# Patient Record
Sex: Female | Born: 1955 | Race: White | Hispanic: No | Marital: Married | Smoking: Never smoker
Health system: Southern US, Community
[De-identification: ages and names within clinical notes are randomized; demographics above are authoritative.]

## PROBLEM LIST (undated history)

## (undated) DIAGNOSIS — M4120 Other idiopathic scoliosis, site unspecified: Secondary | ICD-10-CM

## (undated) DIAGNOSIS — R0789 Other chest pain: Secondary | ICD-10-CM

## (undated) DIAGNOSIS — N809 Endometriosis, unspecified: Secondary | ICD-10-CM

## (undated) DIAGNOSIS — Z516 Encounter for desensitization to allergens: Secondary | ICD-10-CM

## (undated) DIAGNOSIS — Z86010 Personal history of colonic polyps: Secondary | ICD-10-CM

## (undated) DIAGNOSIS — K649 Unspecified hemorrhoids: Secondary | ICD-10-CM

## (undated) DIAGNOSIS — T7840XA Allergy, unspecified, initial encounter: Secondary | ICD-10-CM

## (undated) DIAGNOSIS — E785 Hyperlipidemia, unspecified: Secondary | ICD-10-CM

## (undated) HISTORY — DX: Other chest pain: R07.89

## (undated) HISTORY — DX: Personal history of colonic polyps: Z86.010

## (undated) HISTORY — DX: Encounter for desensitization to allergens: Z51.6

## (undated) HISTORY — DX: Unspecified hemorrhoids: K64.9

## (undated) HISTORY — DX: Endometriosis, unspecified: N80.9

## (undated) HISTORY — DX: Hyperlipidemia, unspecified: E78.5

## (undated) HISTORY — DX: Allergy, unspecified, initial encounter: T78.40XA

## (undated) HISTORY — DX: Other idiopathic scoliosis, site unspecified: M41.20

---

## 1982-10-08 HISTORY — PX: OSTEOTOMY: SHX137

## 1999-12-05 ENCOUNTER — Other Ambulatory Visit: Admission: RE | Admit: 1999-12-05 | Discharge: 1999-12-05 | Payer: Self-pay | Admitting: Obstetrics and Gynecology

## 2000-04-05 ENCOUNTER — Encounter (INDEPENDENT_AMBULATORY_CARE_PROVIDER_SITE_OTHER): Payer: Self-pay

## 2000-04-05 ENCOUNTER — Ambulatory Visit (HOSPITAL_COMMUNITY): Admission: RE | Admit: 2000-04-05 | Discharge: 2000-04-05 | Payer: Self-pay | Admitting: Obstetrics and Gynecology

## 2000-12-10 ENCOUNTER — Other Ambulatory Visit: Admission: RE | Admit: 2000-12-10 | Discharge: 2000-12-10 | Payer: Self-pay | Admitting: Obstetrics and Gynecology

## 2002-10-08 HISTORY — PX: ENDOMETRIAL ABLATION: SHX621

## 2003-06-22 ENCOUNTER — Other Ambulatory Visit: Admission: RE | Admit: 2003-06-22 | Discharge: 2003-06-22 | Payer: Self-pay | Admitting: Obstetrics and Gynecology

## 2003-08-10 ENCOUNTER — Encounter: Payer: Self-pay | Admitting: Internal Medicine

## 2003-08-12 ENCOUNTER — Encounter: Payer: Self-pay | Admitting: Internal Medicine

## 2004-09-05 ENCOUNTER — Other Ambulatory Visit: Admission: RE | Admit: 2004-09-05 | Discharge: 2004-09-05 | Payer: Self-pay | Admitting: Obstetrics and Gynecology

## 2005-09-28 ENCOUNTER — Encounter: Payer: Self-pay | Admitting: Internal Medicine

## 2005-10-02 ENCOUNTER — Other Ambulatory Visit: Admission: RE | Admit: 2005-10-02 | Discharge: 2005-10-02 | Payer: Self-pay | Admitting: Obstetrics and Gynecology

## 2007-01-02 ENCOUNTER — Encounter: Payer: Self-pay | Admitting: Internal Medicine

## 2007-08-12 ENCOUNTER — Encounter: Payer: Self-pay | Admitting: Internal Medicine

## 2007-12-14 LAB — CONVERTED CEMR LAB: Pap Smear: NORMAL

## 2008-03-16 ENCOUNTER — Encounter: Payer: Self-pay | Admitting: Internal Medicine

## 2008-08-27 ENCOUNTER — Encounter: Payer: Self-pay | Admitting: Internal Medicine

## 2008-09-16 ENCOUNTER — Encounter: Payer: Self-pay | Admitting: Internal Medicine

## 2008-09-24 ENCOUNTER — Ambulatory Visit: Payer: Self-pay | Admitting: Internal Medicine

## 2008-09-24 DIAGNOSIS — N809 Endometriosis, unspecified: Secondary | ICD-10-CM

## 2008-09-24 DIAGNOSIS — M4120 Other idiopathic scoliosis, site unspecified: Secondary | ICD-10-CM | POA: Insufficient documentation

## 2008-09-24 DIAGNOSIS — K649 Unspecified hemorrhoids: Secondary | ICD-10-CM | POA: Insufficient documentation

## 2008-09-24 DIAGNOSIS — L719 Rosacea, unspecified: Secondary | ICD-10-CM | POA: Insufficient documentation

## 2008-09-24 DIAGNOSIS — R0789 Other chest pain: Secondary | ICD-10-CM

## 2008-09-24 DIAGNOSIS — N6019 Diffuse cystic mastopathy of unspecified breast: Secondary | ICD-10-CM | POA: Insufficient documentation

## 2008-09-24 HISTORY — DX: Unspecified hemorrhoids: K64.9

## 2008-09-24 HISTORY — DX: Endometriosis, unspecified: N80.9

## 2008-09-24 HISTORY — DX: Other idiopathic scoliosis, site unspecified: M41.20

## 2008-09-24 HISTORY — DX: Other chest pain: R07.89

## 2008-09-24 LAB — CONVERTED CEMR LAB: Vit D, 1,25-Dihydroxy: 49 (ref 30–89)

## 2008-09-29 ENCOUNTER — Encounter: Payer: Self-pay | Admitting: Internal Medicine

## 2008-09-29 LAB — CONVERTED CEMR LAB
ALT: 18 units/L (ref 0–35)
AST: 24 units/L (ref 0–37)
Albumin: 4.2 g/dL (ref 3.5–5.2)
Alkaline Phosphatase: 25 units/L — ABNORMAL LOW (ref 39–117)
BUN: 11 mg/dL (ref 6–23)
Basophils Absolute: 0 10*3/uL (ref 0.0–0.1)
Basophils Relative: 0.1 % (ref 0.0–3.0)
Bilirubin, Direct: 0.1 mg/dL (ref 0.0–0.3)
CO2: 28 meq/L (ref 19–32)
CRP, High Sensitivity: <1 — ABNORMAL LOW (ref 0.00–5.00)
Calcium: 9.3 mg/dL (ref 8.4–10.5)
Chloride: 106 meq/L (ref 96–112)
Cholesterol: 211 mg/dL (ref 0–200)
Creatinine, Ser: 0.7 mg/dL (ref 0.4–1.2)
Direct LDL: 109.2 mg/dL
Eosinophils Absolute: 0.1 10*3/uL (ref 0.0–0.7)
Eosinophils Relative: 1.9 % (ref 0.0–5.0)
GFR calc Af Amer: 114 mL/min
GFR calc non Af Amer: 94 mL/min
Glucose, Bld: 93 mg/dL (ref 70–99)
HCT: 36.1 % (ref 36.0–46.0)
HDL: 90.3 mg/dL (ref >39.0)
Hemoglobin: 12.3 g/dL (ref 12.0–15.0)
Lymphocytes Relative: 22.8 % (ref 12.0–46.0)
MCHC: 34 g/dL (ref 30.0–36.0)
MCV: 95.6 fL (ref 78.0–100.0)
Monocytes Absolute: 0.5 10*3/uL (ref 0.1–1.0)
Monocytes Relative: 8.4 % (ref 3.0–12.0)
Neutro Abs: 3.7 10*3/uL (ref 1.4–7.7)
Neutrophils Relative %: 66.8 % (ref 43.0–77.0)
Platelets: 257 10*3/uL (ref 150–400)
Potassium: 3.8 meq/L (ref 3.5–5.1)
RBC: 3.78 M/uL — ABNORMAL LOW (ref 3.87–5.11)
RDW: 12.8 % (ref 11.5–14.6)
Sodium: 140 meq/L (ref 135–145)
TSH: 1.11 microintl units/mL (ref 0.35–5.50)
Total Bilirubin: 0.7 mg/dL (ref 0.3–1.2)
Total CHOL/HDL Ratio: 2.3
Total Protein: 7.1 g/dL (ref 6.0–8.3)
Triglycerides: 28 mg/dL (ref 0–149)
VLDL: 6 mg/dL (ref 0–40)
WBC: 5.6 10*3/uL (ref 4.5–10.5)

## 2008-10-04 DIAGNOSIS — Z860100 Personal history of colon polyps, unspecified: Secondary | ICD-10-CM

## 2008-10-04 HISTORY — DX: Personal history of colon polyps, unspecified: Z86.0100

## 2008-10-04 HISTORY — DX: Personal history of colonic polyps: Z86.010

## 2008-11-30 ENCOUNTER — Encounter: Payer: Self-pay | Admitting: *Deleted

## 2008-11-30 ENCOUNTER — Encounter: Payer: Self-pay | Admitting: Internal Medicine

## 2008-11-30 LAB — CONVERTED CEMR LAB: Pap Smear: NORMAL

## 2008-12-15 ENCOUNTER — Encounter: Payer: Self-pay | Admitting: *Deleted

## 2009-12-29 ENCOUNTER — Encounter: Payer: Self-pay | Admitting: Internal Medicine

## 2010-01-09 ENCOUNTER — Encounter: Payer: Self-pay | Admitting: Internal Medicine

## 2010-01-10 ENCOUNTER — Encounter: Payer: Self-pay | Admitting: Internal Medicine

## 2010-02-21 ENCOUNTER — Encounter: Payer: Self-pay | Admitting: Internal Medicine

## 2010-11-07 NOTE — Letter (Signed)
Summary: Test Results Letter/[facility]  Test Results Letter/[facility]   Imported By: Maryln Gottron 01/20/2010 08:55:35  _____________________________________________________________________  External Attachment:    Type:   Image     Comment:   External Document

## 2010-11-07 NOTE — Assessment & Plan Note (Signed)
Summary: to be est/pt will come in fasting/ok per doc/njr   Vital Signs:  Patient Profile:   55 Years Old Female Height:     62.5 inches Weight:      113 pounds BMI:     20.41 Pulse rate:   70 / minute BP sitting:   120 / 72  (left arm) Cuff size:   regular  Vitals Entered By: Romualdo Bolk, CMA (September 24, 2008 8:11 AM)                 Chief Complaint:  New Patient.  History of Present Illness: Tracey Patrick is here to get established. She has no PCP but gyne and GI.  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 [facility] BRINGS RECORDS TODAY  for review. Discuss hemorrhoids  recurrent   had sclerotherapy She has recurrent signs despite this rx under colonoscopy. Has questions about  family counselors : family stress.  49 yo step son  at home.  Pt had a cup of tea no cream or sugar . Pt would like to have labs done. Has not had done in a while .     Prior Medications Reviewed Using: Patient Recall  Updated Prior Medication List: MULTIVITAMINS   TABS (MULTIPLE VITAMIN)  OMEGA-3 COMPLEX 192-251-11 MG-MG-UNIT CAPS (DHA-EPA-VITAMIN E)  FLAX   OIL (FLAXSEED (LINSEED))  VITAMIN D 1000 UNIT  TABS (CHOLECALCIFEROL)  RED YEAST RICE 600 MG CAPS (RED YEAST RICE EXTRACT)  CALCIUM CITRATE   POWD (CALCIUM CITRATE TETRAHYDRATE)  EYE VITAMINS  CAPS (MULTIPLE VITAMINS-MINERALS)  * PROBIOTICS  * RESVERATROL- GRAPE SEED EXTRACT  ASPIRIN 81 MG  TABS (ASPIRIN)   Current Allergies (reviewed today): No known allergies   Past Medical History:      G1P0    Echo  2003/03/17 normal Eval by Dr Daleen Squibb for atypical chest pain    Endometriosis    Mild rosacea        LAST    Mammogram: 3/09    Pap: 3/09    Td: 09/29/99    Colonscopy: 08/2008    EKG: 2003-03-17    Dexa: 3/08    Eye Exam: n/a    Smoking: never    Consults    Dr. Kinnie Scales    Dr. Adalberto Ill    Dr. Juanito Doom    Dr. Marciano Sequin    Blood transfusion    Colonic polyps, hx of  Past Surgical History:    Colonoscopy  and Hemorrhoid injections 11/09    Laparoscopy (endometriosis) 03-16-00    Maxillary Osteotomy for overbite 1984    Colon polypectomy 11/09   Family History:    Father: CVS, MI, osteoarthristis, hypertension, psoriasis, GI ulcers, Macular Degeneration, Hypercholesterolemia, giant cell arteritis    Mother: Died of MVA 03-16-2005, breast ca, hypertension, elevated cholesterol, mild MI, fibroid uterine tumors requiring hysterectomy, djd, osteoporosis    Siblings:  hypertenstion, elevated cholesterol, gout and rosacea  Social History:    Occupation: MD opthalmologist.    Never Smoked    Alcohol use-yes  1per night     Drug use-no    Regular exercise-yes    HH of 3  : husband and 4 yo step son.    Married   Risk Factors:  Tobacco use:  never Drug use:  no Alcohol use:  yes    Type:  wine    Drinks per day:  1 Exercise:  yes    Times per week:  5  Type:  aerobics and yoga  Colonoscopy History:     Date of Last Colonoscopy:  08/27/2008    Results:  Normal   PAP Smear History:     Date of Last PAP Smear:  12/14/2007    Results:  Normal   Mammogram History:     Date of Last Mammogram:  12/07/2007    Results:  Normal Bilateral    Review of Systems  The patient denies anorexia, fever, vision loss, syncope, dyspnea on exertion, peripheral edema, prolonged cough, abdominal pain, melena, hematochezia, severe indigestion/heartburn, depression, unusual weight change, and enlarged lymph nodes.         almost regular.       Physical Exam  General:     Well-developed,well-nourished,in no acute distress; alert,appropriate and cooperative throughout examination Head:     normocephalic and atraumatic.   Eyes:     vision grossly intact.   Neck:     No deformities, masses, or tenderness noted.no thyromegaly.   Chest Wall:     ? mild scoliosis sitting Lungs:     Normal respiratory effort, chest expands symmetrically. Lungs are clear to auscultation, no crackles or wheezes. Heart:      normal rate, regular rhythm, no gallop, and no lifts.   Abdomen:     Bowel sounds positive,abdomen soft and non-tender without masses, organomegalynoted. Pulses:     intact wiithout delay Extremities:     no cce  Neurologic:     non focal alert & oriented X3 and gait normal.   Skin:     turgor normal, color normal, no ecchymoses, and no petechiae.   Cervical Nodes:     No lymphadenopathy noted Psych:     Oriented X3, good eye contact, not anxious appearing, and not depressed appearing.   Additional Exam:     records received and reviewed.     Impression & Recommendations:  Problem # 1:  HEMORRHOIDS (ICD-455.6) S/p rx and call if needs refilll on meds. She will call for Surg consult  when  approprite.   Problem # 2:  PREVENTIVE HEALTH CARE (ICD-V70.0) fammily stress due for labs routine and risk assessment. Disc family stress and names of potential family therapists. Orders: T-Vitamin D (25-Hydroxy) [phone]) Venipuncture [phone]) TLB-TSH (Thyroid Stimulating Hormone) (84443-TSH) TLB-Hepatic/Liver Function Pnl (80076-HEPATIC) TLB-CBC Platelet - w/Differential (85025-CBCD) TLB-BMP (Basic Metabolic Panel-BMET) (80048-METABOL) TLB-Lipid Panel (80061-LIPID) TLB-CRP-Full Range (C-Reactive Protein) (86140-FCRP)   Problem # 3:  ENDOMETRIOSIS (ICD-617.9) Assessment: Comment Only  Problem # 4:  FIBROCYSTIC BREAST DISEASE (ICD-610.1) Assessment: Comment Only  Problem # 5:  SCOLIOSIS (ICD-737.30) Assessment: Comment Only  Problem # 6:  ROSACEA (ICD-695.3) Assessment: Comment Only  Complete Medication List: 1)  Multivitamins Tabs (Multiple vitamin) 2)  Omega-3 Complex 192-251-11 Mg-mg-unit Caps (Dha-epa-vitamin e) 3)  Flax Oil (Flaxseed (linseed)) 4)  Vitamin D 1000 Unit Tabs (Cholecalciferol) 5)  Red Yeast Rice 600 Mg Caps (Red yeast rice extract) 6)  Calcium Citrate Powd (Calcium citrate tetrahydrate) 7)  Eye Vitamins Caps (Multiple vitamins-minerals) 8)   Probiotics  9)  Resveratrol- Grape Seed Extract  10)  Aspirin 81 Mg Tabs (Aspirin) 11)  Lidocaine-hydrocortisone Ace 3-0.5 % Crea (Lidocaine-hydrocortisone ace) .... Apply rectally two times a day as directed  Other Orders: Tdap => 23yrs IM (57846) Admin 1st Vaccine (96295)   Patient Instructions: 1)  will notify your of lab results  2)  If ok  Yearly cpx  or as needed.   ]  Tetanus/Td Vaccine    Vaccine Type: Tdap  Site: right deltoid    Mfr: Sanofi Pasteur    Dose: 0.5 ml    Route: IM    Given by: Romualdo Bolk, CMA    Exp. Date: 10/15/2010    Lot #: [id number]

## 2010-11-07 NOTE — Letter (Signed)
Summary: Test Results Letter/[facility]  Test Results Letter/[facility]   Imported By: Maryln Gottron 01/20/2010 08:57:48  _____________________________________________________________________  External Attachment:    Type:   Image     Comment:   External Document

## 2010-11-07 NOTE — Letter (Signed)
Summary: Generic Letter  [place] [facility]  [address] [place], [place] [address]   Phone: [phone]  Fax: [phone]    09/29/2008  [facility] [address] [place], [place]  [address]  Dear Dr. Elmer Picker,  Your labs are good including your lipids. I have included a copy for your records as well. If you have any questions, please give me a call at [phone].         Sincerely,   Tor Netters, CMA [place] [facility]

## 2010-11-07 NOTE — Letter (Signed)
Summary: Immunization Records 1957 thru 2006  Immunization Records 1957 thru 2006   Imported By: Maryln Gottron 10/18/2008 12:47:51  _____________________________________________________________________  External Attachment:    Type:   Image     Comment:   External Document

## 2010-11-07 NOTE — Miscellaneous (Signed)
Summary: MMG and Pap results  Clinical Lists Changes  Observations: Added new observation of TDBOOSTDUE: 09/24/2018 (12/15/2008 9:29) Added new observation of HDLNXTDUE: 09/24/2013 (12/15/2008 9:29) Added new observation of LDLNXTDUE: 09/24/2013 (12/15/2008 9:29) Added new observation of COLONNXTDUE: 08/27/2018 (12/15/2008 9:29) Added new observation of PAP DUE: 12/13/2008 (12/15/2008 9:29) Added new observation of MAMMO DUE: 12/06/2008 (12/15/2008 9:29) Added new observation of CREATNXTDUE: 09/24/2009 (12/15/2008 9:29) Added new observation of POTASSIUMDUE: 09/24/2009 (12/15/2008 9:29) Added new observation of MAMMOGRAM: normal (11/30/2008 9:30) Added new observation of PAP SMEAR: normal (11/30/2008 9:30)         Preventive Care Screening  Colonoscopy:    Next Due:  08/27/2018  Pap Smear:    Date:  11/30/2008    Next Due:  12/13/2008    Results:  normal  Mammogram:    Date:  11/30/2008    Next Due:  12/06/2008    Results:  normal

## 2010-11-07 NOTE — Progress Notes (Signed)
Summary: Health History provided by patient  Health History provided by patient   Imported By: Maryln Gottron 10/18/2008 13:01:20  _____________________________________________________________________  External Attachment:    Type:   Image     Comment:   External Document

## 2010-11-07 NOTE — Procedures (Signed)
Summary: Colonoscopy/[facility]   Colonoscopy/[facility]   Imported By: Maryln Gottron 10/18/2008 12:31:30  _____________________________________________________________________  External Attachment:    Type:   Image     Comment:   External Document

## 2011-01-26 ENCOUNTER — Encounter: Payer: Self-pay | Admitting: Internal Medicine

## 2011-02-23 NOTE — Op Note (Signed)
[facility]  Patient:    Tracey Patrick, Tracey Patrick                 MRN: [id number] Proc. Date: 04/05/00 Adm. Date:  [id number] Attending:  Cordelia Pen Ii                           Operative Report  PREOPERATIVE DIAGNOSIS:       Dysmenorrhea.  POSTOPERATIVE DIAGNOSES:      1. Endometriosis.                               2. Uterine leiomyoma.  PROCEDURES:                   1. Laparoscopy with resection and ablation of                                  endometriosis.                               2. Uterine myomectomies.  SURGEON:                      Guy Sandifer. Arleta Creek, M.D.  ANESTHESIA:                   General with endotracheal intubation.  ESTIMATED BLOOD LOSS:         20 cc.  INDICATIONS AND CONSENT:      The patient is a 55 year old married white female, G1, P0, AB1 with pelvic pain.  The details are dictated in the history and physical.  Laparoscopy was discussed with the patient.  The possible risks and complications have been discussed including (but not limited to) infection; bowel, bladder or ureteral damage; bleeding requiring transfusion of blood products with possible transfusion reaction, HIV or hepatitis acquisition: DVT, PE and pneumonia.  All questions were answered and consent was signed on the chart.  FINDINGS:                     The upper abdomen was normal.  The appendix was normal.  The uterus had a 1-2 cm subserosal fibroid on the posterior upper uterine fundus in the midline.  The ovaries were normal bilaterally.  The left fallopian tube contained a 2 cm paratubal cyst.  The posterior cul-de-sac was clean.  There was a heavy, contracted, dark black implant of endometriosis along the course of the left ureter.  There was a second, deeply rooted black implant of endometriosis at the cervical insertion of the left uterosacral ligament.  There was a dark blue implant of endometriosis immediately inferior to the left  uteroovarian ligament.  The left pelvic sidewall contained multiple dark black but smaller implants of endometriosis.  There was also an implant of endometriosis on the right vesicouterine fold of the peritoneum.  DESCRIPTION OF PROCEDURE:     The patient was taken to the operating room and placed in the dorsal supine position, where general anesthesia was induced via endotracheal intubation.  She was then placed in the dorsal lithotomy position, where she was prepped abdominal and vaginally, the bladder straight catheterized and she was draped in a sterile fashion.  A Hulka tenaculum was placed in the uterus  as a Financial trader.  A small infraumbilical incision was made and a 10/11 disposable trocar sleeve was placed without difficulty. Placement was verified with the laparoscope and no damage to surrounding structures was noted.  Pneumoperitoneum was induced.  A small suprapubic incision was made and a 5 mm nondisposable trocar sleeve was placed under direct visualization without difficulty.  After noting the above findings, a smaller incision in the peritoneal sidewall above the course of the left ureter was made.  Hydrodissection was then carried out.  Then, using careful sharp dissection, the implant over the left ureter was carefully resected. This was done without interrupting the ureter.  The surrounding adipose tissue and vessels on the ureter were also intact.  No cautery was necessary to obtain hemostasis at this site.  Hydrodissection and resection was used to remove the implant on the left uterosacral ligament and the cervical insertion, as well as on the right vesicouterine fold.  Bipolar cautery was used at the peritoneal margin to obtain hemostasis at these sites.  The remainder of the sites were ablated with bipolar cautery.  The course of the ureters were noted before and after the above was done.  Both ureters were peristalsing normally following this.  The stalk of the left  paratubal cyst was cauterized.  It was resected and sent to pathology.  The leiomyoma of the posterior uterine wall was also resected in a very superficial fashion. Bipolar cautery was used to obtain hemostasis there, as well.  Irrigation was carried out and all returns were clear.  INTERCEED was back loaded into the laparoscope and placed over the left pelvic sidewall and on the posterior uterine fundus.  This was slightly moistened.  Excess fluid was removed.  The suprapubic trocar sleeve was removed and pneumoperitoneum was reduced.  No bleeding was noted.  Pneumoperitoneum was completely reduced.  the umbilical trocar sleeve was removed.  The skin incisions were closed with subcuticular 3-0 Vicryl suture.  The incisions were injected with 0.5% plain Marcaine.  The Hulka tenaculum was removed and no bleeding was noted.  Dressings were applied.  All counts were correct.  The patient was awakened and taken to the recovery room in stable condition. DD:  04/05/00 TD:  04/06/00 Job: [id number]

## 2011-02-23 NOTE — H&P (Signed)
[facility]  Patient:    Tracey Patrick, Tracey Patrick                 MRN: [id number] Adm. Date:  04/05/00 Attending:  Guy Sandifer. Arleta Creek, M.D.                         History and Physical  CHIEF COMPLAINT:                  Pelvic pain.  HISTORY OF PRESENT ILLNESS:       This patient is a 55 year old married white female, G1, P0, AB1, complaining of right lower quadrant pain.  It is primarily premenstrual with a component of dysmenorrhea which is getting progressively worse over the past year.  There is a nearly daily component of right lower quadrant pain which is aggravated by bowel movements or exertion. The pain has improved somewhat lately, although the dysmenorrhea continues to be significant.  Pelvic exam reveals the uterus slightly displaced in the left adnexa, upper limits of normal size.  Ultrasound on January 02, 2000, revealed the uterus measuring 7.6 x 4.6 x 4.0 cm.  Multiple small leiomyomas are noted ranging from 5 mm to 1.8 cm in size.  Ovaries have small follicular cysts bilaterally with no dominant masses.  No free fluid in the cul-de-sac was noted.  After careful discussion of the options, laparoscopy is discussed. She is being admitted for this procedure.  PAST MEDICAL HISTORY:             Constipation.  PAST SURGICAL HISTORY:            Maxillary osteotomy in 1984 with autologous blood transfusion.  MEDICATIONS:                      Minocin p.r.n.  ALLERGIES:                        No known drug allergies.  FAMILY HISTORY:                   The patients mother is a twin.  Breast cancer in patients mother at age 7.  Chronic hypertension in mother.  SOCIAL HISTORY:                   The patient consumes alcohol on a social basis, denies tobacco or drug abuse.  OBSTETRICAL HISTORY:              TAB x 1 in 1979.  REVIEW OF SYSTEMS:                Negative except as above.  PHYSICAL EXAMINATION:  VITAL SIGNS:                       Height 5 feet 2 inches, weight 117-1/2 pounds.  Blood pressure 110/70.  NECK:                             Without thyromegaly.  LUNGS:                            Clear to auscultation.  HEART:  Regular rate and rhythm.  BACK:                             Without CVA tenderness.  BREASTS:                          Without mass, retraction, discharge.  ABDOMEN:                          Soft, nontender, without mass.  PELVIC:                           Vulva, vagina, cervix without lesion. Uterus is anteverted, upper limits of normal size, displaced to the left. Left adnexa nontender without masses.  Right adnexa mildly tender.  EXTREMITIES:                      Grossly within normal limits.  NEUROLOGIC:                       Grossly within normal limits.  ASSESSMENT:                       Pelvic pain and dysmenorrhea.  PLAN:                             Laparoscopy. DD:  04/01/00 TD:  04/01/00 Job: [id number]

## 2012-09-25 ENCOUNTER — Encounter: Payer: Self-pay | Admitting: Internal Medicine

## 2013-05-26 ENCOUNTER — Ambulatory Visit: Payer: Self-pay | Admitting: Internal Medicine

## 2013-09-11 ENCOUNTER — Encounter (INDEPENDENT_AMBULATORY_CARE_PROVIDER_SITE_OTHER): Payer: Self-pay | Admitting: General Surgery

## 2013-09-11 ENCOUNTER — Ambulatory Visit (INDEPENDENT_AMBULATORY_CARE_PROVIDER_SITE_OTHER): Payer: BC Managed Care – PPO | Admitting: General Surgery

## 2013-09-11 VITALS — BP 90/62 | HR 84 | Temp 97.8°F | Resp 14 | Ht 62.0 in | Wt 105.0 lb

## 2013-09-11 DIAGNOSIS — K602 Anal fissure, unspecified: Secondary | ICD-10-CM | POA: Insufficient documentation

## 2013-09-11 MED ORDER — AMBULATORY NON FORMULARY MEDICATION: 1.0000 "application " | Freq: Four times a day (QID) | Status: DC

## 2013-09-11 NOTE — Progress Notes (Signed)
Chief Complaint  Patient presents with  . Hemorrhoids    HISTORY: Tracey Patrick is a 57 y.o. female who presents to the office with rectal pain.  Other symptoms include blood on TP, itching, bloating.  This had been occurring for 5 y.  This last episode has lasted for 3 months.  She has tried steroid suppositories in the past with no lasting success.  Constipation makes the symptoms worse.   It is continuous in nature.  Her bowel habits are daily and her bowel movements are usually soft.  Her fiber intake is dietary.  She has tried a fiber supplement for about a month that didn't help much.  She intermittently drinks enough water.   Her last colonoscopy was 5y ago and is due next year.  She has pain during BM's and when wiping.  She saw Dr Carolynne Edouard in the past for similar symptoms ans was given diltiazem cream.  This did not seem to help much.   No past medical history on file.    Past Surgical History  Procedure Laterality Date  . Endometrial ablation  2004        Current Outpatient Prescriptions  Medication Sig Dispense Refill  . CALCIUM PO Take 500 mg by mouth daily.      . Multiple Vitamin (MULTIVITAMIN) tablet Take 1 tablet by mouth daily.      . Omega-3 Fatty Acids (FISH OIL PO) Take 1 g by mouth daily.      Weyman Croon Hazel (PREPARATION H EX) Apply topically daily.      . AMBULATORY NON FORMULARY MEDICATION Place 1 application rectally 4 (four) times daily. Diltiazem 2% compounded suspension.  1 Tube  2   No current facility-administered medications for this visit.      No Known Allergies    Family History  Problem Relation Age of Onset  . Breast cancer Mother     History   Social History  . Marital Status: Married    Spouse Name: N/A    Number of Children: N/A  . Years of Education: N/A   Social History Main Topics  . Smoking status: Never Smoker   . Smokeless tobacco: Not on file  . Alcohol Use: Yes     Comment: wine nightly  . Drug Use: No  . Sexual Activity: Not on  file   Other Topics Concern  . Not on file   Social History Narrative  . No narrative on file      REVIEW OF SYSTEMS - PERTINENT POSITIVES ONLY: Review of Systems - General ROS: negative for - chills, fever or weight loss Hematological and Lymphatic ROS: negative for - bleeding problems, blood clots or bruising Respiratory ROS: no cough, shortness of breath, or wheezing Cardiovascular ROS: no chest pain or dyspnea on exertion Gastrointestinal ROS: no abdominal pain, change in bowel habits, or black or bloody stools Genito-Urinary ROS: no dysuria, trouble voiding, or hematuria  EXAM: Filed Vitals:   09/11/13 1551  BP: 90/62  Pulse: 84  Temp: 97.8 F (36.6 C)  Resp: 14    General appearance: alert and cooperative Resp: clear to auscultation bilaterally Cardio: regular rate and rhythm GI: normal findings: soft, non-tender   Procedure: Anoscopy Surgeon: Maisie Fus Diagnosis: anal pain  Assistant: Spillers After the risks and benefits were explained, verbal consent was obtained for above procedure  Anesthesia: none Findings: anterior anal fissure, no hemorrhoid disease, definite sphincter hypertension     ASSESSMENT AND PLAN: Tracey Patrick is a 57 y.o. F  with recurrent anal pain and bleeding.  On exam she has an anterior anal fissure.  She has tried diltiazem in the past with little success, but she did not use it 4 times a day.  We discussed ways to control constipation, including daily fiber intake, stool softeners and drinking plenty of water.  We will try the diltiazem cream again with sitz baths after BM's.  I will see her back in 6 weeks.         Vanita Panda, MD Colon and Rectal Surgery / General Surgery [facility]      Visit Diagnoses: 1. Anal fissure     Primary Care Physician: Lorretta Harp, MD

## 2013-09-11 NOTE — Patient Instructions (Signed)
WHAT IS AN ANAL FISSURE? An anal fissure (fissure-in-ano) is a small, oval shaped tear in skin that lines the opening of the anus. Fissures typically cause severe pain and bleeding with bowel movements. Fissures are quite common in the general population, but are often confused with other causes of pain and bleeding, such as hemorrhoids. WHAT ARE THE SYMPTOMS OF AN ANAL FISSURE? The typical symptoms of an anal fissure include severe pain during, and especially after, a bowel movement, lasting from several minutes to a few hours. Patients may also notice bright red blood from the anus that can be seen on the toilet paper or on the stool. Between bowel movements, patients with anal fissures are often relatively symptom-free. Many patients are fearful of having a bowel movement and may try to avoid defecation secondary to the pain.  WHAT CAUSES AN ANAL FISSURE? Fissures are usually caused by trauma to the inner lining of the anus. Patients with tight anal sphincter muscles (i.e., increased muscle tone) are more prone to developing anal fissures. A hard, dry bowel movement is typically responsible, but loose stools and diarrhea can also be the cause. Following a bowel movement, severe anal pain can produce spasm of the anal sphincter muscle, resulting in a decrease in blood flow to the site of the injury, thus impairing healing of the wound. The next bowel movement results in more pain, anal spasm, decreased blood flow to the area, and the cycle continues. Treatments are aimed at interrupting this cycle by relaxing the anal sphincter muscle to promote healing of the fissure.  Other, less common, causes include inflammatory conditions and certain anal infections or tumors. Anal fissures may be acute (recent onset) or chronic (present for a long period of time). Chronic fissures may be more difficult to treat, and may also have an external lump associated with the tear, called a sentinel pile or skin tag, as well as  extra tissue just inside the anal canal (hypertrophied papilla) . WHAT IS THE TREATMENT OF ANAL FISSURES? The majority of anal fissures do not require surgery. The most common treatment for an acute anal fissure consists of making the stool more formed and bulky with a diet high in fiber and utilization of over-the-counter fiber supplementation (totaling 25-35 grams of fiber/day). Stool softeners and increasing water intake may be necessary to promote soft bowel movements and aid in the healing process. Topical anesthetics for pain and warm tub baths (sitz baths) for 10-20 minutes several times a day (especially after bowel movements) are soothing and promote relaxation of the anal muscles, which may help the healing process.  Other medications (such as nitroglycerin, nifedipine, or diltiazem) may be prescribed that allow relaxation of the anal sphincter muscles. Your surgeon will go over benefits and side-effects of each of these with you. Narcotic pain medications are not recommended for anal fissures, as they promote constipation. Chronic fissures are generally more difficult to treat, and your surgeon may advise surgical treatment. Take a stool softener like colace 1-2 times a day while you are having pain with bowel movements.  WILL THE PROBLEM RETURN? Fissures can recur easily, and it is quite common for a fully healed fissure to recur after a hard bowel movement or other trauma. Even when the pain and bleeding have subsided, it is very important to continue good bowel habits and a diet high in fiber as a lifestyle change. If the problem returns without an obvious cause, further assessment is warranted. WHAT CAN BE DONE IF THE FISSURE  DOES NOT HEAL? A fissure that fails to respond to conservative measures should be re-examined. Persistent hard or loose bowel movements, scarring, or spasm of the internal anal muscle all contribute to delayed healing. Other medical problems such as inflammatory bowel  disease (Crohn's disease), infections, or anal tumors can cause symptoms similar to anal fissures. Patients suffering from persistent anal pain should be examined to exclude these symptoms. This may include a colonoscopy or an exam in the operating room under anesthesia. WHAT DOES SURGERY INVOLVE? Surgical options for treating anal fissure include Botulinum toxin (Botox) injection into the anal sphincter and surgical division of a portion of the internal anal sphincter (lateral internal sphincterotomy). Both of these are performed typically as outpatient, same-day procedures, or occasionally in the office setting. The goal of these surgical options is to promote relaxation of the anal sphincter, thereby decreasing anal pain and spasm, allowing the fissure to heal. Botox injection results in healing in 50-80% of patients, while sphincterotomy is reported to be over 90% successful. If a sentinel pile is present, it may be removed to promote healing of the fissure. All surgical procedures carry some risk, and a sphincterotomy can rarely interfere with one's ability to control gas and stool. Your colon and rectal surgeon will discuss these risks with you to determine the appropriate treatment for your particular situation. HOW LONG IS THE RECOVERY AFTER SURGERY? It is important to note that complete healing with both medical and surgical treatments can take up to approximately 6-10 weeks. However, acute pain after surgery often disappears after a few days. Most patients will be able to return to work and resume daily activities in a few short days after the surgery. CAN FISSURES LEAD TO COLON CANCER? Absolutely not. Persistent symptoms, however, need careful evaluation since other conditions other than an anal fissure can cause similar symptoms. Your colon and rectal surgeon may request additional tests, even if your fissure has successfully healed. A colonoscopy may be required to exclude other causes of rectal  bleeding. WHAT IS A COLON AND RECTAL SURGEON? Colon and rectal surgeons are experts in the surgical and non-surgical treatment of diseases of the colon, rectum and anus. They have completed advanced surgical training in the treatment of these diseases as well as full general surgical training. Board-certified colon and rectal surgeons complete residencies in general surgery and colon and rectal surgery, and pass intensive examinations conducted by the American Board of Surgery and the American Board of Colon and Rectal Surgery. They are well-versed in the treatment of both benign and malignant diseases of the colon, rectum and anus and are able to perform routine screening examinations and surgically treat conditions if indicated to do so.  Author: Tyrone Apple. Pennie Banter, DO, on behalf of the [employer]   2012 American Society of Colon & Rectal Surgeons     GETTING TO GOOD BOWEL HEALTH. Irregular bowel habits such as constipation can lead to many problems over time.  Having one soft bowel movement a day is the most important way to prevent further problems.  The anorectal canal is designed to handle stretching and feces to safely manage our ability to get rid of solid waste (feces, poop, stool) out of our body.  BUT, hard constipated stools can act like ripping concrete bricks causing inflamed hemorrhoids, anal fissures, abdominal pain and bloating.     The goal: ONE SOFT BOWEL MOVEMENT A DAY!  To have soft, regular bowel movements:    Drink at least 8  tall glasses of water a day.     Take plenty of fiber.  Fiber is the undigested part of plant food that passes into the colon, acting s "natures broom" to encourage bowel motility and movement.  Fiber can absorb and hold large amounts of water. This results in a larger, bulkier stool, which is soft and easier to pass. Work gradually over several weeks up to 6 servings a day of fiber (25g a day even more if needed) in the form  of: o Vegetables -- Root (potatoes, carrots, turnips), leafy green (lettuce, salad greens, celery, spinach), or cooked high residue (cabbage, broccoli, etc) o Fruit -- Fresh (unpeeled skin & pulp), Dried (prunes, apricots, cherries, etc ),  or stewed ( applesauce)  o Whole grain breads, pasta, etc (whole wheat)  o Bran cereals    Bulking Agents -- This type of water-retaining fiber generally is easily obtained each day by one of the following:  o Psyllium bran -- The psyllium plant is remarkable because its ground seeds can retain so much water. This product is available as Metamucil, Konsyl, Effersyllium, Per Diem Fiber, or the less expensive generic preparation in drug and health food stores. Although labeled a laxative, it really is not a laxative.  o Methylcellulose -- This is another fiber derived from wood which also retains water. It is available as Citrucel. o Polyethylene Glycol - and "artificial" fiber commonly called Miralax or Glycolax.  It is helpful for people with gassy or bloated feelings with regular fiber o Flax Seed - a less gassy fiber than psyllium   No reading or other relaxing activity while on the toilet. If bowel movements take longer than 5 minutes, you are too constipated   AVOID CONSTIPATION.  High fiber and water intake usually takes care of this.  Sometimes a laxative is needed to stimulate more frequent bowel movements, but    Laxatives are not a good long-term solution as it can wear the colon out. o Osmotics (Milk of Magnesia, Fleets phosphosoda, Magnesium citrate, MiraLax, GoLytely) are safer than  o Stimulants (Senokot, Castor Oil, Dulcolax, Ex Lax)    o Do not take laxatives for more than 7days in a row.    IF SEVERELY CONSTIPATED, try a Bowel Retraining Program: o Do not use laxatives.  o Eat a diet high in roughage, such as bran cereals and leafy vegetables.  o Drink six (6) ounces of prune or apricot juice each morning.  o Eat two (2) large servings of  stewed fruit each day.  o Take one (1) heaping tablespoon of a psyllium-based bulking agent twice a day. Use sugar-free sweetener when possible to avoid excessive calories.  o Eat a normal breakfast.  o Set aside 15 minutes after breakfast to sit on the toilet, but do not strain to have a bowel movement.  o If you do not have a bowel movement by the third day, use an enema and repeat the above steps.

## 2013-10-28 ENCOUNTER — Encounter (INDEPENDENT_AMBULATORY_CARE_PROVIDER_SITE_OTHER): Payer: Self-pay | Admitting: General Surgery

## 2013-10-28 ENCOUNTER — Ambulatory Visit (INDEPENDENT_AMBULATORY_CARE_PROVIDER_SITE_OTHER): Payer: BC Managed Care – PPO | Admitting: General Surgery

## 2013-10-28 VITALS — BP 132/68 | HR 64 | Temp 98.4°F | Resp 14 | Ht 62.0 in | Wt 109.2 lb

## 2013-10-28 DIAGNOSIS — K6289 Other specified diseases of anus and rectum: Secondary | ICD-10-CM

## 2013-10-28 MED ORDER — HYDROCORTISONE ACETATE 25 MG RE SUPP: 25.0000 mg | Freq: Two times a day (BID) | RECTAL | Status: DC

## 2013-10-28 MED ORDER — HYDROCORTISONE ACETATE 25 MG RE SUPP: 25.0000 mg | Freq: Every day | RECTAL | Status: DC

## 2013-10-28 NOTE — Progress Notes (Signed)
Tracey Patrick is a 58 y.o. female who is here for a follow up visit regarding her fissure.  She could not tolerate the diltiazem cream.  She is using fiber and colace and drinking more fluids.  It doesn't seem to be getting better.   Objective: Filed Vitals:   10/28/13 1712  BP: 132/68  Pulse: 64  Temp: 98.4 F (36.9 C)  Resp: 14    General appearance: alert and cooperative GI: normal findings: soft, non-tender  Procedure: Anoscopy Surgeon: Tracey Patrick Assistant: Tracey Patrick After the risks and benefits were explained, verbal consent was obtained for above procedure  Anesthesia: none Diagnosis: Anal pain Findings: Anterior anal fissure healed. Internal mucosa with some mild inflammation. NO sphincter hypertension.   Assessment and Plan: It appears her fissure has healed. She is still having pain in the same spot though. On exam I do not see any reason for this. We will try some Anusol suppositories to help minimize or inflammation. She will call back in a couple months if she is continuing to have problems.    Vanita Panda.Gwendelyn Lanting C Cailan Antonucci, MD [facility], [place] [phone]

## 2013-10-28 NOTE — Patient Instructions (Signed)
Try using steroid suppositories for 7 nights straight

## 2014-03-26 ENCOUNTER — Telehealth (INDEPENDENT_AMBULATORY_CARE_PROVIDER_SITE_OTHER): Payer: Self-pay

## 2014-03-26 NOTE — Telephone Encounter (Signed)
Pt was last seen in December 2014 by Dr. Maisie Fushomas for anal pain.  She was diagnosed with an anal fissure and prescribed Diltiazem 2% gel along with sitz baths after BM's. Pt was to f/u in 6 weeks, but did not because she was feeling much better. She says the fissure has flared up again and she is getting ready to go on a trip. She wants to try the cream because the gel burned. Dr. Maisie Fushomas consulted and offered to call in Rx for Lidocaine 5% prn.  Pt refused this Rx.  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

## 2015-12-20 LAB — HM MAMMOGRAPHY

## 2016-10-15 DIAGNOSIS — J301 Allergic rhinitis due to pollen: Secondary | ICD-10-CM | POA: Diagnosis not present

## 2016-10-15 DIAGNOSIS — J3089 Other allergic rhinitis: Secondary | ICD-10-CM | POA: Diagnosis not present

## 2016-10-19 DIAGNOSIS — J301 Allergic rhinitis due to pollen: Secondary | ICD-10-CM | POA: Diagnosis not present

## 2016-10-22 DIAGNOSIS — J3089 Other allergic rhinitis: Secondary | ICD-10-CM | POA: Diagnosis not present

## 2016-10-23 DIAGNOSIS — J3089 Other allergic rhinitis: Secondary | ICD-10-CM | POA: Diagnosis not present

## 2016-10-23 DIAGNOSIS — J301 Allergic rhinitis due to pollen: Secondary | ICD-10-CM | POA: Diagnosis not present

## 2016-10-30 DIAGNOSIS — J301 Allergic rhinitis due to pollen: Secondary | ICD-10-CM | POA: Diagnosis not present

## 2016-10-30 DIAGNOSIS — J3089 Other allergic rhinitis: Secondary | ICD-10-CM | POA: Diagnosis not present

## 2016-11-05 DIAGNOSIS — J301 Allergic rhinitis due to pollen: Secondary | ICD-10-CM | POA: Diagnosis not present

## 2016-11-05 DIAGNOSIS — J3089 Other allergic rhinitis: Secondary | ICD-10-CM | POA: Diagnosis not present

## 2016-11-12 DIAGNOSIS — J3089 Other allergic rhinitis: Secondary | ICD-10-CM | POA: Diagnosis not present

## 2016-11-12 DIAGNOSIS — J301 Allergic rhinitis due to pollen: Secondary | ICD-10-CM | POA: Diagnosis not present

## 2016-11-14 DIAGNOSIS — J3089 Other allergic rhinitis: Secondary | ICD-10-CM | POA: Diagnosis not present

## 2016-11-14 DIAGNOSIS — J301 Allergic rhinitis due to pollen: Secondary | ICD-10-CM | POA: Diagnosis not present

## 2016-11-19 DIAGNOSIS — J301 Allergic rhinitis due to pollen: Secondary | ICD-10-CM | POA: Diagnosis not present

## 2016-11-19 DIAGNOSIS — J3089 Other allergic rhinitis: Secondary | ICD-10-CM | POA: Diagnosis not present

## 2016-11-21 DIAGNOSIS — J301 Allergic rhinitis due to pollen: Secondary | ICD-10-CM | POA: Diagnosis not present

## 2016-11-21 DIAGNOSIS — J3089 Other allergic rhinitis: Secondary | ICD-10-CM | POA: Diagnosis not present

## 2016-11-26 DIAGNOSIS — J3089 Other allergic rhinitis: Secondary | ICD-10-CM | POA: Diagnosis not present

## 2016-11-26 DIAGNOSIS — J301 Allergic rhinitis due to pollen: Secondary | ICD-10-CM | POA: Diagnosis not present

## 2016-11-28 DIAGNOSIS — J301 Allergic rhinitis due to pollen: Secondary | ICD-10-CM | POA: Diagnosis not present

## 2016-11-28 DIAGNOSIS — J3089 Other allergic rhinitis: Secondary | ICD-10-CM | POA: Diagnosis not present

## 2016-12-03 DIAGNOSIS — J3089 Other allergic rhinitis: Secondary | ICD-10-CM | POA: Diagnosis not present

## 2016-12-03 DIAGNOSIS — J301 Allergic rhinitis due to pollen: Secondary | ICD-10-CM | POA: Diagnosis not present

## 2016-12-10 DIAGNOSIS — J3089 Other allergic rhinitis: Secondary | ICD-10-CM | POA: Diagnosis not present

## 2016-12-10 DIAGNOSIS — J301 Allergic rhinitis due to pollen: Secondary | ICD-10-CM | POA: Diagnosis not present

## 2016-12-18 DIAGNOSIS — J3089 Other allergic rhinitis: Secondary | ICD-10-CM | POA: Diagnosis not present

## 2016-12-18 DIAGNOSIS — J301 Allergic rhinitis due to pollen: Secondary | ICD-10-CM | POA: Diagnosis not present

## 2016-12-24 DIAGNOSIS — J301 Allergic rhinitis due to pollen: Secondary | ICD-10-CM | POA: Diagnosis not present

## 2016-12-24 DIAGNOSIS — J3089 Other allergic rhinitis: Secondary | ICD-10-CM | POA: Diagnosis not present

## 2016-12-31 DIAGNOSIS — J3089 Other allergic rhinitis: Secondary | ICD-10-CM | POA: Diagnosis not present

## 2016-12-31 DIAGNOSIS — J301 Allergic rhinitis due to pollen: Secondary | ICD-10-CM | POA: Diagnosis not present

## 2017-01-07 DIAGNOSIS — J301 Allergic rhinitis due to pollen: Secondary | ICD-10-CM | POA: Diagnosis not present

## 2017-01-07 DIAGNOSIS — J3089 Other allergic rhinitis: Secondary | ICD-10-CM | POA: Diagnosis not present

## 2017-01-14 DIAGNOSIS — J301 Allergic rhinitis due to pollen: Secondary | ICD-10-CM | POA: Diagnosis not present

## 2017-01-14 DIAGNOSIS — J3089 Other allergic rhinitis: Secondary | ICD-10-CM | POA: Diagnosis not present

## 2017-01-23 DIAGNOSIS — J301 Allergic rhinitis due to pollen: Secondary | ICD-10-CM | POA: Diagnosis not present

## 2017-01-23 DIAGNOSIS — J3089 Other allergic rhinitis: Secondary | ICD-10-CM | POA: Diagnosis not present

## 2017-01-28 DIAGNOSIS — J301 Allergic rhinitis due to pollen: Secondary | ICD-10-CM | POA: Diagnosis not present

## 2017-01-28 DIAGNOSIS — J3089 Other allergic rhinitis: Secondary | ICD-10-CM | POA: Diagnosis not present

## 2017-02-04 DIAGNOSIS — J3089 Other allergic rhinitis: Secondary | ICD-10-CM | POA: Diagnosis not present

## 2017-02-04 DIAGNOSIS — J301 Allergic rhinitis due to pollen: Secondary | ICD-10-CM | POA: Diagnosis not present

## 2017-02-07 ENCOUNTER — Telehealth: Payer: Self-pay | Admitting: Internal Medicine

## 2017-02-07 NOTE — Telephone Encounter (Signed)
Please advise 

## 2017-02-07 NOTE — Telephone Encounter (Signed)
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

## 2017-02-11 DIAGNOSIS — J3089 Other allergic rhinitis: Secondary | ICD-10-CM | POA: Diagnosis not present

## 2017-02-11 DIAGNOSIS — J301 Allergic rhinitis due to pollen: Secondary | ICD-10-CM | POA: Diagnosis not present

## 2017-02-11 NOTE — Telephone Encounter (Signed)
Ok to make appt for cpx  And can work in if needed .

## 2017-02-12 NOTE — Telephone Encounter (Signed)
Pt has been sch

## 2017-02-12 NOTE — Telephone Encounter (Signed)
FYI

## 2017-02-14 ENCOUNTER — Encounter: Payer: Self-pay | Admitting: Family Medicine

## 2017-02-18 DIAGNOSIS — J301 Allergic rhinitis due to pollen: Secondary | ICD-10-CM | POA: Diagnosis not present

## 2017-02-18 DIAGNOSIS — J3089 Other allergic rhinitis: Secondary | ICD-10-CM | POA: Diagnosis not present

## 2017-02-25 DIAGNOSIS — J3089 Other allergic rhinitis: Secondary | ICD-10-CM | POA: Diagnosis not present

## 2017-02-25 DIAGNOSIS — J301 Allergic rhinitis due to pollen: Secondary | ICD-10-CM | POA: Diagnosis not present

## 2017-03-05 DIAGNOSIS — J301 Allergic rhinitis due to pollen: Secondary | ICD-10-CM | POA: Diagnosis not present

## 2017-03-05 DIAGNOSIS — J3089 Other allergic rhinitis: Secondary | ICD-10-CM | POA: Diagnosis not present

## 2017-03-11 DIAGNOSIS — J301 Allergic rhinitis due to pollen: Secondary | ICD-10-CM | POA: Diagnosis not present

## 2017-03-11 DIAGNOSIS — J3089 Other allergic rhinitis: Secondary | ICD-10-CM | POA: Diagnosis not present

## 2017-03-18 DIAGNOSIS — J301 Allergic rhinitis due to pollen: Secondary | ICD-10-CM | POA: Diagnosis not present

## 2017-03-18 DIAGNOSIS — J3089 Other allergic rhinitis: Secondary | ICD-10-CM | POA: Diagnosis not present

## 2017-03-22 DIAGNOSIS — J301 Allergic rhinitis due to pollen: Secondary | ICD-10-CM | POA: Diagnosis not present

## 2017-03-25 DIAGNOSIS — J3089 Other allergic rhinitis: Secondary | ICD-10-CM | POA: Diagnosis not present

## 2017-03-25 DIAGNOSIS — J301 Allergic rhinitis due to pollen: Secondary | ICD-10-CM | POA: Diagnosis not present

## 2017-04-01 DIAGNOSIS — J301 Allergic rhinitis due to pollen: Secondary | ICD-10-CM | POA: Diagnosis not present

## 2017-04-01 DIAGNOSIS — J3089 Other allergic rhinitis: Secondary | ICD-10-CM | POA: Diagnosis not present

## 2017-04-08 DIAGNOSIS — J3089 Other allergic rhinitis: Secondary | ICD-10-CM | POA: Diagnosis not present

## 2017-04-08 DIAGNOSIS — J301 Allergic rhinitis due to pollen: Secondary | ICD-10-CM | POA: Diagnosis not present

## 2017-04-09 NOTE — Progress Notes (Signed)
Chief Complaint  Patient presents with  . Annual Exam    HPI: Patient  Tracey Patrick  61 y.o. comes in today for [place] visit  Last visit   Was over 4 years ago  Sees gyne yearly and utd.   Had labs done  At hospital physician screening has / about  Poss CAC score for poss  Statin consideration. She has no cp sob fam hx pos lipids  Sibs on meds  Father had cva? Related to  Vioxx but  Now 91  No premature vascular disease     Husband  Had cac incidental finding of atherosclerosis  Due colon 2020   Health Maintenance  Topic Date Due  . Hepatitis C Screening  04/[birth year]/1958  . HIV Screening  12/28/1972  . INFLUENZA VACCINE  05/08/2017  . MAMMOGRAM  12/19/2017  . COLONOSCOPY  08/08/2018  . TETANUS/TDAP  09/24/2018  . PAP SMEAR  12/07/2018   Health Maintenance Review LIFESTYLE:  Exercise:   200 min per week.  Low impact  And weight  Tobacco/ETS:  no Alcohol:   One 6 per week Sugar beverages: no Sleep: about 9 hours  Drug use: no  HH of  2 4 pets  Pound puppies  Work: 20 60 per week. q   ROS:  GEN/ HEENT: No fever, significant weight changes sweats headaches vision problems hearing changes, CV/ PULM; No chest pain shortness of breath cough, syncope,edema  change in exercise tolerance. GI /GU: No adominal pain, vomiting, change in bowel habits. No blood in the stool. No significant GU symptoms. SKIN/HEME: ,no acute skin rashes suspicious lesions or bleeding. No lymphadenopathy, nodules, masses.  NEURO/ PSYCH:  No neurologic signs such as weakness numbness. No depression anxiety. IMM/ Allergy: No unusual infections.  Allergy .   REST of 12 system review negative except as per HPI   Past Medical History:  Diagnosis Date  . CHEST PAIN, ATYPICAL 09/24/2008   Qualifier: Diagnosis of  By: Hulan Saas, CMA (AAMA), [place], HX OF 10/04/2008   Qualifier: Diagnosis of  By: Regis Bill MD, [place] Bro[place]   . Desensitization to allergy shot    van winkle  2016    . Endometriosis   . ENDOMETRIOSIS 09/24/2008   Qualifier: Diagnosis of  By: Hulan Saas, CMA (AAMA), [place]   . HEMORRHOIDS 09/24/2008   Qualifier: Diagnosis of  By: Hulan Saas, CMA (AAMA), [place]   . SCOLIOSIS 09/24/2008   Qualifier: Diagnosis of  By: Hulan Saas, CMA (AAMA), [place]     Past Surgical History:  Procedure Laterality Date  . ENDOMETRIAL ABLATION  2004  . OSTEOTOMY  1984   for overbite   maxillary     Family History  Problem Relation Age of Onset  . Breast cancer Mother        died mva age 41  . Macular degeneration Father   . CVA Father        felt secondary to Vioxx  . Hyperlipidemia Father   . Hyperlipidemia Brother        3 brothes   . Alcohol abuse Paternal Grandfather     Social History   Social History  . Marital status: Married    Spouse name: N/A  . Number of children: N/A  . Years of education: N/A   Social History Main Topics  . Smoking status: Never Smoker  . Smokeless tobacco: Never Used  . Alcohol use 3.6 oz/week    6 Glasses of  wine per week     Comment: wine nightly  . Drug use: No  . Sexual activity: Not Asked   Other Topics Concern  . None   Social History Narrative   hh of 2  Married    MD opthalmologist  Never tobacco    G1P0    Outpatient Medications Prior to Visit  Medication Sig Dispense Refill  . CALCIUM PO Take 200 mg by mouth 2 (two) times daily.     Marland Kitchen docusate sodium (COLACE) 100 MG capsule Take 100 mg by mouth daily.     . hydrocortisone (ANUSOL-HC) 25 MG suppository Place 1 suppository (25 mg total) rectally at bedtime. 12 suppository 2  . Multiple Vitamin (MULTIVITAMIN) tablet Take 1 tablet by mouth daily.    . Omega-3 Fatty Acids (FISH OIL PO) Take 1 g by mouth daily.    Addison Lank Hazel (PREPARATION H EX) Apply topically daily.    . psyllium (REGULOID) 0.52 G capsule Take 0.52 g by mouth daily.     No facility-administered medications prior to visit.      EXAM:  BP 104/62 (BP Location: Right Arm)    Temp 98.3 F (36.8 C) (Oral)   Ht '5\' 2"'  (1.575 m)   Wt 110 lb (49.9 kg)   BMI 20.12 kg/m   Body mass index is 20.12 kg/m. Wt Readings from Last 3 Encounters:  04/12/17 110 lb (49.9 kg)  10/28/13 109 lb 3.2 oz (49.5 kg)  09/11/13 105 lb (47.6 kg)    Physical Exam: Vital signs reviewed YSH:UOHF is a well-developed well-nourished alert cooperative    who appearsr stated age in no acute distress.  HEENT: normocephalic atraumatic , Eyes: PERRL EOM's full, conjunctiva clear, Nares: paten,t no deformity discharge or tenderness., Ears: no deformity EAC's clear TMs with normal landmarks. Mouth: clear OP, no lesions, edema.  Moist mucous membranes. Dentition in adequate repair. NECK: supple without masses, thyromegaly or bruits. CHEST/PULM:  Clear to auscultation and percussion breath sounds equal no wheeze , rales or rhonchi. No chest wall deformities or tenderness. Breast: normal by inspection . No dimpling, discharge, masses, tenderness or discharge . CV: PMI is nondisplaced, S1 S2 no gallops, murmurs, rubs. Peripheral pulses are full without delay.No JVD .  ABDOMEN: Bowel sounds normal nontender  No guard or rebound, no hepato splenomegal no CVA tenderness.  No hernia. Extremtities:  No clubbing cyanosis or edema, no acute joint swelling or redness no focal atrophy NEURO:  Oriented x3, cranial nerves 3-12 appear to be intact, no obvious focal weakness,gait within normal limits no abnormal reflexes or asymmetrical SKIN: No acute rashes normal turgor, color, no bruising or petechiae. PSYCH: Oriented, good eye contact, no obvious depression anxiety, cognition and judgment appear normal. LN: no cervical axillary inguinal adenopathy  lipid tc 240 hdl 110 ldl97  ratio  BP Readings from Last 3 Encounters:  04/12/17 104/62  10/28/13 132/68  09/11/13 90/62    Lab results reviewed with patient to be scanned into EHR.   ASSESSMENT AND PLAN:  Discussed the following assessment and  plan:  Visit for preventive health examination  Need for shingles vaccine - Plan: Varicella-zoster vaccine IM  Family history of hyperlipidemia dsic ascvd risk assessment and no fam hx of premature disease would not advise cac at this time   Will follow disc about guidelines  and parameters  ToContinue lifestyle intervention healthy eating and exercise . Reviewed   Patient Care Team: Richmond Campbell, MD as Consulting Physician (Gastroenterology) Dian Queen, MD  as Consulting Physician (Obstetrics and Gynecology) Patient Instructions  Continue lifestyle intervention healthy eating and exercise .  YOu ascvd risk   10 year is 1.5 %  Based on current guidelines   No likely benefit from a statin   And no indication .  Factors include age  BP DM tobacco .  You also do not seem to have a high risk family history at this time .   CAC score can be helpful in situations of  Uncertain risk  To decide to  Use statins etc but no studies  In primary prevention are really that good for outcomes data.    Get second Shingrix in 2-6 months   Get an EYE check.    Preventive Care 40-64 Years, Female Preventive care refers to lifestyle choices and visits with your health care provider that can promote health and wellness. What does preventive care include?  A yearly physical exam. This is also called an annual well check.  Dental exams once or twice a year.  Routine eye exams. Ask your health care provider how often you should have your eyes checked.  Personal lifestyle choices, including: ? Daily care of your teeth and gums. ? Regular physical activity. ? Eating a healthy diet. ? Avoiding tobacco and drug use. ? Limiting alcohol use. ? Practicing safe sex. ? Taking low-dose aspirin daily starting at age 66. ? Taking vitamin and mineral supplements as recommended by your health care provider. What happens during an annual well check? The services and screenings done by your health care  provider during your annual well check will depend on your age, overall health, lifestyle risk factors, and family history of disease. Counseling Your health care provider may ask you questions about your:  Alcohol use.  Tobacco use.  Drug use.  Emotional well-being.  Home and relationship well-being.  Sexual activity.  Eating habits.  Work and work Statistician.  Method of birth control.  Menstrual cycle.  Pregnancy history.  Screening You may have the following tests or measurements:  Height, weight, and BMI.  Blood pressure.  Lipid and cholesterol levels. These may be checked every 5 years, or more frequently if you are over 69 years old.  Skin check.  Lung cancer screening. You may have this screening every year starting at age 50 if you have a 30-pack-year history of smoking and currently smoke or have quit within the past 15 years.  Fecal occult blood test (FOBT) of the stool. You may have this test every year starting at age 74.  Flexible sigmoidoscopy or colonoscopy. You may have a sigmoidoscopy every 5 years or a colonoscopy every 10 years starting at age 33.  Hepatitis C blood test.  Hepatitis B blood test.  Sexually transmitted disease (STD) testing.  Diabetes screening. This is done by checking your blood sugar (glucose) after you have not eaten for a while (fasting). You may have this done every 1-3 years.  Mammogram. This may be done every 1-2 years. Talk to your health care provider about when you should start having regular mammograms. This may depend on whether you have a family history of breast cancer.  BRCA-related cancer screening. This may be done if you have a family history of breast, ovarian, tubal, or peritoneal cancers.  Pelvic exam and Pap test. This may be done every 3 years starting at age 25. Starting at age 85, this may be done every 5 years if you have a Pap test in combination with an HPV  test.  Bone density scan. This is done  to screen for osteoporosis. You may have this scan if you are at high risk for osteoporosis.  Discuss your test results, treatment options, and if necessary, the need for more tests with your health care provider. Vaccines Your health care provider may recommend certain vaccines, such as:  Influenza vaccine. This is recommended every year.  Tetanus, diphtheria, and acellular pertussis (Tdap, Td) vaccine. You may need a Td booster every 10 years.  Varicella vaccine. You may need this if you have not been vaccinated.  Zoster vaccine. You may need this after age 79.  Measles, mumps, and rubella (MMR) vaccine. You may need at least one dose of MMR if you were born in 1957 or later. You may also need a second dose.  Pneumococcal 13-valent conjugate (PCV13) vaccine. You may need this if you have certain conditions and were not previously vaccinated.  Pneumococcal polysaccharide (PPSV23) vaccine. You may need one or two doses if you smoke cigarettes or if you have certain conditions.  Meningococcal vaccine. You may need this if you have certain conditions.  Hepatitis A vaccine. You may need this if you have certain conditions or if you travel or work in places where you may be exposed to hepatitis A.  Hepatitis B vaccine. You may need this if you have certain conditions or if you travel or work in places where you may be exposed to hepatitis B.  Haemophilus influenzae type b (Hib) vaccine. You may need this if you have certain conditions.  Talk to your health care provider about which screenings and vaccines you need and how often you need them. This information is not intended to replace advice given to you by your health care provider. Make sure you discuss any questions you have with your health care provider. Document Released: 10/21/2015 Document Revised: 06/13/2016 Document Reviewed: 07/26/2015 Elsevier Interactive Patient Education  2017 [place] refers to food and lifestyle choices that are based on the traditions of countries located on the [employer]. This way of eating has been shown to help prevent certain conditions and improve outcomes for people who have chronic diseases, like kidney disease and heart disease. What are tips for following this plan? Lifestyle  Cook and eat meals together with your family, when possible.  Drink enough fluid to keep your urine clear or pale yellow.  Be physically active every day. This includes: ? Aerobic exercise like running or swimming. ? Leisure activities like gardening, walking, or housework.  Get 7-8 hours of sleep each night.  If recommended by your health care provider, drink red wine in moderation. This means 1 glass a day for nonpregnant women and 2 glasses a day for men. A glass of wine equals 5 oz (150 mL). Reading food labels  Check the serving size of packaged foods. For foods such as rice and pasta, the serving size refers to the amount of cooked product, not dry.  Check the total fat in packaged foods. Avoid foods that have saturated fat or trans fats.  Check the ingredients list for added sugars, such as corn syrup. Shopping  At the grocery store, buy most of your food from the areas near the walls of the store. This includes: ? Fresh fruits and vegetables (produce). ? Grains, beans, nuts, and seeds. Some of these may be available in unpackaged forms or large amounts (in bulk). ? Fresh seafood. ? Poultry and eggs. ?  Low-fat dairy products.  Buy whole ingredients instead of prepackaged foods.  Buy fresh fruits and vegetables in-season from local farmers markets.  Buy frozen fruits and vegetables in resealable bags.  If you do not have access to quality fresh seafood, buy precooked frozen shrimp or canned fish, such as tuna, salmon, or sardines.  Buy small amounts of raw or cooked vegetables, salads, or olives from the deli or salad bar at  your store.  Stock your pantry so you always have certain foods on hand, such as olive oil, canned tuna, canned tomatoes, rice, pasta, and beans. Cooking  Cook foods with extra-virgin olive oil instead of using butter or other vegetable oils.  Have meat as a side dish, and have vegetables or grains as your main dish. This means having meat in small portions or adding small amounts of meat to foods like pasta or stew.  Use beans or vegetables instead of meat in common dishes like chili or lasagna.  Experiment with different cooking methods. Try roasting or broiling vegetables instead of steaming or sauteing them.  Add frozen vegetables to soups, stews, pasta, or rice.  Add nuts or seeds for added healthy fat at each meal. You can add these to yogurt, salads, or vegetable dishes.  Marinate fish or vegetables using olive oil, lemon juice, garlic, and fresh herbs. Meal planning  Plan to eat 1 vegetarian meal one day each week. Try to work up to 2 vegetarian meals, if possible.  Eat seafood 2 or more times a week.  Have healthy snacks readily available, such as: ? Vegetable sticks with hummus. ? [place] yogurt. ? Fruit and nut trail mix.  Eat balanced meals throughout the week. This includes: ? Fruit: 2-3 servings a day ? Vegetables: 4-5 servings a day ? Low-fat dairy: 2 servings a day ? Fish, poultry, or lean meat: 1 serving a day ? Beans and legumes: 2 or more servings a week ? Nuts and seeds: 1-2 servings a day ? Whole grains: 6-8 servings a day ? Extra-virgin olive oil: 3-4 servings a day  Limit red meat and sweets to only a few servings a month What are my food choices?  Mediterranean diet ? Recommended ? Grains: Whole-grain pasta. Brown rice. Bulgar wheat. Polenta. Couscous. Whole-wheat bread. [place]. ? Vegetables: Artichokes. Beets. Broccoli. Cabbage. Carrots. Eggplant. Green beans. Chard. Kale. Spinach. Onions. Leeks. Peas. Squash. Tomatoes. Peppers.  Radishes. ? Fruits: Apples. Apricots. Avocado. Berries. Bananas. Cherries. Dates. Figs. Grapes. Lemons. Melon. Oranges. Peaches. Plums. Pomegranate. ? Meats and other protein foods: Beans. Almonds. Sunflower seeds. Pine nuts. Peanuts. [place]. Salmon. Scallops. Shrimp. [place]. Tilapia. Clams. Oysters. Eggs. ? Dairy: Low-fat milk. Cheese. Greek yogurt. ? Beverages: Water. Red wine. Herbal tea. ? Fats and oils: Extra virgin olive oil. Avocado oil. Grape seed oil. ? Sweets and desserts: [place] yogurt with honey. Baked apples. Poached pears. Trail mix. ? Seasoning and other foods: Basil. Cilantro. Coriander. Cumin. Mint. Parsley. Sage. Rosemary. Tarragon. Garlic. Oregano. Thyme. Pepper. Balsalmic vinegar. Tahini. Hummus. Tomato sauce. Olives. Mushrooms. ? Limit these ? Grains: Prepackaged pasta or rice dishes. Prepackaged cereal with added sugar. ? Vegetables: Deep fried potatoes (french fries). ? Fruits: Fruit canned in syrup. ? Meats and other protein foods: Beef. Pork. Lamb. Poultry with skin. Hot dogs. [place] Salines. ? Dairy: Ice cream. Sour cream. Whole milk. ? Beverages: Juice. Sugar-sweetened soft drinks. Beer. Liquor and spirits. ? Fats and oils: Butter. [place] oil. Vegetable oil. Beef fat (tallow). Lard. ? Sweets and desserts: Cookies. Cakes. Pies.  Candy. ? Seasoning and other foods: Mayonnaise. Premade sauces and marinades. ? The items listed may not be a complete list. Talk with your dietitian about what dietary choices are right for you. Summary  The Mediterranean diet includes both food and lifestyle choices.  Eat a variety of fresh fruits and vegetables, beans, nuts, seeds, and whole grains.  Limit the amount of red meat and sweets that you eat.  Talk with your health care provider about whether it is safe for you to drink red wine in moderation. This means 1 glass a day for nonpregnant women and 2 glasses a day for men. A glass of wine equals 5 oz (150 mL). This information is not intended to  replace advice given to you by your health care provider. Make sure you discuss any questions you have with your health care provider. Document Released: 05/17/2016 Document Revised: 06/19/2016 Document Reviewed: 05/17/2016 Elsevier Interactive Patient Education  2018 [place]. Panosh M.D.

## 2017-04-12 ENCOUNTER — Ambulatory Visit (INDEPENDENT_AMBULATORY_CARE_PROVIDER_SITE_OTHER): Payer: BLUE CROSS/BLUE SHIELD | Admitting: Internal Medicine

## 2017-04-12 ENCOUNTER — Encounter: Payer: Self-pay | Admitting: Internal Medicine

## 2017-04-12 VITALS — BP 104/62 | Temp 98.3°F | Ht 62.0 in | Wt 110.0 lb

## 2017-04-12 DIAGNOSIS — J3089 Other allergic rhinitis: Secondary | ICD-10-CM | POA: Diagnosis not present

## 2017-04-12 DIAGNOSIS — Z8349 Family history of other endocrine, nutritional and metabolic diseases: Secondary | ICD-10-CM

## 2017-04-12 DIAGNOSIS — J301 Allergic rhinitis due to pollen: Secondary | ICD-10-CM | POA: Diagnosis not present

## 2017-04-12 DIAGNOSIS — Z23 Encounter for immunization: Secondary | ICD-10-CM | POA: Diagnosis not present

## 2017-04-12 DIAGNOSIS — Z83438 Family history of other disorder of lipoprotein metabolism and other lipidemia: Secondary | ICD-10-CM

## 2017-04-12 DIAGNOSIS — Z0000 Encounter for general adult medical examination without abnormal findings: Secondary | ICD-10-CM | POA: Diagnosis not present

## 2017-04-12 NOTE — Patient Instructions (Signed)
Continue lifestyle intervention healthy eating and exercise .  YOu ascvd risk   10 year is 1.5 %  Based on current guidelines   No likely benefit from a statin   And no indication .  Factors include age  BP DM tobacco .  You also do not seem to have a high risk family history at this time .   CAC score can be helpful in situations of  Uncertain risk  To decide to  Use statins etc but no studies  In primary prevention are really that good for outcomes data.    Get second Shingrix in 2-6 months   Get an EYE check.    Preventive Care 40-64 Years, Female Preventive care refers to lifestyle choices and visits with your health care provider that can promote health and wellness. What does preventive care include?  A yearly physical exam. This is also called an annual well check.  Dental exams once or twice a year.  Routine eye exams. Ask your health care provider how often you should have your eyes checked.  Personal lifestyle choices, including: ? Daily care of your teeth and gums. ? Regular physical activity. ? Eating a healthy diet. ? Avoiding tobacco and drug use. ? Limiting alcohol use. ? Practicing safe sex. ? Taking low-dose aspirin daily starting at age 36. ? Taking vitamin and mineral supplements as recommended by your health care provider. What happens during an annual well check? The services and screenings done by your health care provider during your annual well check will depend on your age, overall health, lifestyle risk factors, and family history of disease. Counseling Your health care provider may ask you questions about your:  Alcohol use.  Tobacco use.  Drug use.  Emotional well-being.  Home and relationship well-being.  Sexual activity.  Eating habits.  Work and work Statistician.  Method of birth control.  Menstrual cycle.  Pregnancy history.  Screening You may have the following tests or measurements:  Height, weight, and BMI.  Blood  pressure.  Lipid and cholesterol levels. These may be checked every 5 years, or more frequently if you are over 16 years old.  Skin check.  Lung cancer screening. You may have this screening every year starting at age 25 if you have a 30-pack-year history of smoking and currently smoke or have quit within the past 15 years.  Fecal occult blood test (FOBT) of the stool. You may have this test every year starting at age 36.  Flexible sigmoidoscopy or colonoscopy. You may have a sigmoidoscopy every 5 years or a colonoscopy every 10 years starting at age 53.  Hepatitis C blood test.  Hepatitis B blood test.  Sexually transmitted disease (STD) testing.  Diabetes screening. This is done by checking your blood sugar (glucose) after you have not eaten for a while (fasting). You may have this done every 1-3 years.  Mammogram. This may be done every 1-2 years. Talk to your health care provider about when you should start having regular mammograms. This may depend on whether you have a family history of breast cancer.  BRCA-related cancer screening. This may be done if you have a family history of breast, ovarian, tubal, or peritoneal cancers.  Pelvic exam and Pap test. This may be done every 3 years starting at age 73. Starting at age 60, this may be done every 5 years if you have a Pap test in combination with an HPV test.  Bone density scan. This is done to  screen for osteoporosis. You may have this scan if you are at high risk for osteoporosis.  Discuss your test results, treatment options, and if necessary, the need for more tests with your health care provider. Vaccines Your health care provider may recommend certain vaccines, such as:  Influenza vaccine. This is recommended every year.  Tetanus, diphtheria, and acellular pertussis (Tdap, Td) vaccine. You may need a Td booster every 10 years.  Varicella vaccine. You may need this if you have not been vaccinated.  Zoster vaccine. You  may need this after age 80.  Measles, mumps, and rubella (MMR) vaccine. You may need at least one dose of MMR if you were born in 1957 or later. You may also need a second dose.  Pneumococcal 13-valent conjugate (PCV13) vaccine. You may need this if you have certain conditions and were not previously vaccinated.  Pneumococcal polysaccharide (PPSV23) vaccine. You may need one or two doses if you smoke cigarettes or if you have certain conditions.  Meningococcal vaccine. You may need this if you have certain conditions.  Hepatitis A vaccine. You may need this if you have certain conditions or if you travel or work in places where you may be exposed to hepatitis A.  Hepatitis B vaccine. You may need this if you have certain conditions or if you travel or work in places where you may be exposed to hepatitis B.  Haemophilus influenzae type b (Hib) vaccine. You may need this if you have certain conditions.  Talk to your health care provider about which screenings and vaccines you need and how often you need them. This information is not intended to replace advice given to you by your health care provider. Make sure you discuss any questions you have with your health care provider. Document Released: 10/21/2015 Document Revised: 06/13/2016 Document Reviewed: 07/26/2015 Elsevier Interactive Patient Education  2017 [place] refers to food and lifestyle choices that are based on the traditions of countries located on the [employer]. This way of eating has been shown to help prevent certain conditions and improve outcomes for people who have chronic diseases, like kidney disease and heart disease. What are tips for following this plan? Lifestyle  Cook and eat meals together with your family, when possible.  Drink enough fluid to keep your urine clear or pale yellow.  Be physically active every day. This includes: ? Aerobic exercise like  running or swimming. ? Leisure activities like gardening, walking, or housework.  Get 7-8 hours of sleep each night.  If recommended by your health care provider, drink red wine in moderation. This means 1 glass a day for nonpregnant women and 2 glasses a day for men. A glass of wine equals 5 oz (150 mL). Reading food labels  Check the serving size of packaged foods. For foods such as rice and pasta, the serving size refers to the amount of cooked product, not dry.  Check the total fat in packaged foods. Avoid foods that have saturated fat or trans fats.  Check the ingredients list for added sugars, such as corn syrup. Shopping  At the grocery store, buy most of your food from the areas near the walls of the store. This includes: ? Fresh fruits and vegetables (produce). ? Grains, beans, nuts, and seeds. Some of these may be available in unpackaged forms or large amounts (in bulk). ? Fresh seafood. ? Poultry and eggs. ? Low-fat dairy products.  Buy whole ingredients instead  of prepackaged foods.  Buy fresh fruits and vegetables in-season from local farmers markets.  Buy frozen fruits and vegetables in resealable bags.  If you do not have access to quality fresh seafood, buy precooked frozen shrimp or canned fish, such as tuna, salmon, or sardines.  Buy small amounts of raw or cooked vegetables, salads, or olives from the deli or salad bar at your store.  Stock your pantry so you always have certain foods on hand, such as olive oil, canned tuna, canned tomatoes, rice, pasta, and beans. Cooking  Cook foods with extra-virgin olive oil instead of using butter or other vegetable oils.  Have meat as a side dish, and have vegetables or grains as your main dish. This means having meat in small portions or adding small amounts of meat to foods like pasta or stew.  Use beans or vegetables instead of meat in common dishes like chili or lasagna.  Experiment with different cooking methods.  Try roasting or broiling vegetables instead of steaming or sauteing them.  Add frozen vegetables to soups, stews, pasta, or rice.  Add nuts or seeds for added healthy fat at each meal. You can add these to yogurt, salads, or vegetable dishes.  Marinate fish or vegetables using olive oil, lemon juice, garlic, and fresh herbs. Meal planning  Plan to eat 1 vegetarian meal one day each week. Try to work up to 2 vegetarian meals, if possible.  Eat seafood 2 or more times a week.  Have healthy snacks readily available, such as: ? Vegetable sticks with hummus. ? [place] yogurt. ? Fruit and nut trail mix.  Eat balanced meals throughout the week. This includes: ? Fruit: 2-3 servings a day ? Vegetables: 4-5 servings a day ? Low-fat dairy: 2 servings a day ? Fish, poultry, or lean meat: 1 serving a day ? Beans and legumes: 2 or more servings a week ? Nuts and seeds: 1-2 servings a day ? Whole grains: 6-8 servings a day ? Extra-virgin olive oil: 3-4 servings a day  Limit red meat and sweets to only a few servings a month What are my food choices?  Mediterranean diet ? Recommended ? Grains: Whole-grain pasta. Brown rice. Bulgar wheat. Polenta. Couscous. Whole-wheat bread. [place]. ? Vegetables: Artichokes. Beets. Broccoli. Cabbage. Carrots. Eggplant. Green beans. Chard. Kale. Spinach. Onions. Leeks. Peas. Squash. Tomatoes. Peppers. Radishes. ? Fruits: Apples. Apricots. Avocado. Berries. Bananas. Cherries. Dates. Figs. Grapes. Lemons. Melon. Oranges. Peaches. Plums. Pomegranate. ? Meats and other protein foods: Beans. Almonds. Sunflower seeds. Pine nuts. Peanuts. [place]. Salmon. Scallops. Shrimp. [place]. Tilapia. Clams. Oysters. Eggs. ? Dairy: Low-fat milk. Cheese. Greek yogurt. ? Beverages: Water. Red wine. Herbal tea. ? Fats and oils: Extra virgin olive oil. Avocado oil. Grape seed oil. ? Sweets and desserts: [place] yogurt with honey. Baked apples. Poached pears. Trail mix. ? Seasoning  and other foods: Basil. Cilantro. Coriander. Cumin. Mint. Parsley. Sage. Rosemary. Tarragon. Garlic. Oregano. Thyme. Pepper. Balsalmic vinegar. Tahini. Hummus. Tomato sauce. Olives. Mushrooms. ? Limit these ? Grains: Prepackaged pasta or rice dishes. Prepackaged cereal with added sugar. ? Vegetables: Deep fried potatoes (french fries). ? Fruits: Fruit canned in syrup. ? Meats and other protein foods: Beef. Pork. Lamb. Poultry with skin. Hot dogs. [place] Salines. ? Dairy: Ice cream. Sour cream. Whole milk. ? Beverages: Juice. Sugar-sweetened soft drinks. Beer. Liquor and spirits. ? Fats and oils: Butter. Canola oil. Vegetable oil. Beef fat (tallow). Lard. ? Sweets and desserts: Cookies. Cakes. Pies. Candy. ? Seasoning and other foods: Mayonnaise. Premade  sauces and marinades. ? The items listed may not be a complete list. Talk with your dietitian about what dietary choices are right for you. Summary  The Mediterranean diet includes both food and lifestyle choices.  Eat a variety of fresh fruits and vegetables, beans, nuts, seeds, and whole grains.  Limit the amount of red meat and sweets that you eat.  Talk with your health care provider about whether it is safe for you to drink red wine in moderation. This means 1 glass a day for nonpregnant women and 2 glasses a day for men. A glass of wine equals 5 oz (150 mL). This information is not intended to replace advice given to you by your health care provider. Make sure you discuss any questions you have with your health care provider. Document Released: 05/17/2016 Document Revised: 06/19/2016 Document Reviewed: 05/17/2016 Elsevier Interactive Patient Education  [employer].

## 2017-04-15 DIAGNOSIS — J3089 Other allergic rhinitis: Secondary | ICD-10-CM | POA: Diagnosis not present

## 2017-04-15 DIAGNOSIS — J301 Allergic rhinitis due to pollen: Secondary | ICD-10-CM | POA: Diagnosis not present

## 2017-04-17 DIAGNOSIS — J301 Allergic rhinitis due to pollen: Secondary | ICD-10-CM | POA: Diagnosis not present

## 2017-04-17 DIAGNOSIS — J3089 Other allergic rhinitis: Secondary | ICD-10-CM | POA: Diagnosis not present

## 2017-04-23 DIAGNOSIS — J3089 Other allergic rhinitis: Secondary | ICD-10-CM | POA: Diagnosis not present

## 2017-04-23 DIAGNOSIS — J301 Allergic rhinitis due to pollen: Secondary | ICD-10-CM | POA: Diagnosis not present

## 2017-04-26 ENCOUNTER — Telehealth: Payer: Self-pay | Admitting: Internal Medicine

## 2017-04-26 DIAGNOSIS — J301 Allergic rhinitis due to pollen: Secondary | ICD-10-CM | POA: Diagnosis not present

## 2017-04-26 DIAGNOSIS — J3089 Other allergic rhinitis: Secondary | ICD-10-CM | POA: Diagnosis not present

## 2017-04-26 NOTE — Telephone Encounter (Signed)
Please advise 

## 2017-04-26 NOTE — Telephone Encounter (Signed)
Ok to do this  Under screening

## 2017-04-26 NOTE — Telephone Encounter (Signed)
Pt would like hep c blood work. Please put order in system

## 2017-04-29 ENCOUNTER — Other Ambulatory Visit: Payer: Self-pay | Admitting: Emergency Medicine

## 2017-04-29 DIAGNOSIS — Z1159 Encounter for screening for other viral diseases: Secondary | ICD-10-CM

## 2017-04-29 DIAGNOSIS — J3089 Other allergic rhinitis: Secondary | ICD-10-CM | POA: Diagnosis not present

## 2017-04-29 DIAGNOSIS — J301 Allergic rhinitis due to pollen: Secondary | ICD-10-CM | POA: Diagnosis not present

## 2017-04-29 NOTE — Telephone Encounter (Signed)
The lab has been ordered. Left a VM for patient to give the office a call back and schedule an lab visit.

## 2017-04-29 NOTE — Telephone Encounter (Signed)
Pt has been sch for 07-12-17 for blood work and shingrix vaccine. Pt is aware by email

## 2017-05-06 DIAGNOSIS — J3089 Other allergic rhinitis: Secondary | ICD-10-CM | POA: Diagnosis not present

## 2017-05-06 DIAGNOSIS — J301 Allergic rhinitis due to pollen: Secondary | ICD-10-CM | POA: Diagnosis not present

## 2017-05-13 DIAGNOSIS — J3089 Other allergic rhinitis: Secondary | ICD-10-CM | POA: Diagnosis not present

## 2017-05-13 DIAGNOSIS — J301 Allergic rhinitis due to pollen: Secondary | ICD-10-CM | POA: Diagnosis not present

## 2017-05-20 DIAGNOSIS — J3089 Other allergic rhinitis: Secondary | ICD-10-CM | POA: Diagnosis not present

## 2017-05-20 DIAGNOSIS — J301 Allergic rhinitis due to pollen: Secondary | ICD-10-CM | POA: Diagnosis not present

## 2017-05-27 DIAGNOSIS — J301 Allergic rhinitis due to pollen: Secondary | ICD-10-CM | POA: Diagnosis not present

## 2017-05-27 DIAGNOSIS — J3089 Other allergic rhinitis: Secondary | ICD-10-CM | POA: Diagnosis not present

## 2017-06-03 DIAGNOSIS — J3089 Other allergic rhinitis: Secondary | ICD-10-CM | POA: Diagnosis not present

## 2017-06-03 DIAGNOSIS — J301 Allergic rhinitis due to pollen: Secondary | ICD-10-CM | POA: Diagnosis not present

## 2017-06-11 DIAGNOSIS — J3089 Other allergic rhinitis: Secondary | ICD-10-CM | POA: Diagnosis not present

## 2017-06-11 DIAGNOSIS — J301 Allergic rhinitis due to pollen: Secondary | ICD-10-CM | POA: Diagnosis not present

## 2017-06-17 DIAGNOSIS — J3089 Other allergic rhinitis: Secondary | ICD-10-CM | POA: Diagnosis not present

## 2017-06-17 DIAGNOSIS — J301 Allergic rhinitis due to pollen: Secondary | ICD-10-CM | POA: Diagnosis not present

## 2017-06-24 DIAGNOSIS — J301 Allergic rhinitis due to pollen: Secondary | ICD-10-CM | POA: Diagnosis not present

## 2017-06-24 DIAGNOSIS — J3089 Other allergic rhinitis: Secondary | ICD-10-CM | POA: Diagnosis not present

## 2017-06-28 ENCOUNTER — Encounter: Payer: Self-pay | Admitting: Internal Medicine

## 2017-07-01 DIAGNOSIS — J3089 Other allergic rhinitis: Secondary | ICD-10-CM | POA: Diagnosis not present

## 2017-07-01 DIAGNOSIS — J301 Allergic rhinitis due to pollen: Secondary | ICD-10-CM | POA: Diagnosis not present

## 2017-07-08 DIAGNOSIS — J301 Allergic rhinitis due to pollen: Secondary | ICD-10-CM | POA: Diagnosis not present

## 2017-07-08 DIAGNOSIS — J3089 Other allergic rhinitis: Secondary | ICD-10-CM | POA: Diagnosis not present

## 2017-07-12 ENCOUNTER — Other Ambulatory Visit: Payer: BLUE CROSS/BLUE SHIELD

## 2017-07-12 ENCOUNTER — Ambulatory Visit: Payer: BLUE CROSS/BLUE SHIELD

## 2017-07-12 DIAGNOSIS — J3089 Other allergic rhinitis: Secondary | ICD-10-CM | POA: Diagnosis not present

## 2017-07-12 DIAGNOSIS — H1045 Other chronic allergic conjunctivitis: Secondary | ICD-10-CM | POA: Diagnosis not present

## 2017-07-12 DIAGNOSIS — J301 Allergic rhinitis due to pollen: Secondary | ICD-10-CM | POA: Diagnosis not present

## 2017-07-12 DIAGNOSIS — R0602 Shortness of breath: Secondary | ICD-10-CM | POA: Diagnosis not present

## 2017-07-15 DIAGNOSIS — J3089 Other allergic rhinitis: Secondary | ICD-10-CM | POA: Diagnosis not present

## 2017-07-15 DIAGNOSIS — J301 Allergic rhinitis due to pollen: Secondary | ICD-10-CM | POA: Diagnosis not present

## 2017-07-19 ENCOUNTER — Other Ambulatory Visit: Payer: BLUE CROSS/BLUE SHIELD

## 2017-07-19 ENCOUNTER — Ambulatory Visit: Payer: BLUE CROSS/BLUE SHIELD

## 2017-07-22 DIAGNOSIS — J3089 Other allergic rhinitis: Secondary | ICD-10-CM | POA: Diagnosis not present

## 2017-07-22 DIAGNOSIS — J301 Allergic rhinitis due to pollen: Secondary | ICD-10-CM | POA: Diagnosis not present

## 2017-07-25 DIAGNOSIS — J301 Allergic rhinitis due to pollen: Secondary | ICD-10-CM | POA: Diagnosis not present

## 2017-07-26 ENCOUNTER — Ambulatory Visit (INDEPENDENT_AMBULATORY_CARE_PROVIDER_SITE_OTHER): Payer: BLUE CROSS/BLUE SHIELD

## 2017-07-26 ENCOUNTER — Other Ambulatory Visit (INDEPENDENT_AMBULATORY_CARE_PROVIDER_SITE_OTHER): Payer: BLUE CROSS/BLUE SHIELD

## 2017-07-26 ENCOUNTER — Other Ambulatory Visit: Payer: BLUE CROSS/BLUE SHIELD

## 2017-07-26 DIAGNOSIS — Z23 Encounter for immunization: Secondary | ICD-10-CM | POA: Diagnosis not present

## 2017-07-26 DIAGNOSIS — J3089 Other allergic rhinitis: Secondary | ICD-10-CM | POA: Diagnosis not present

## 2017-07-26 DIAGNOSIS — Z1159 Encounter for screening for other viral diseases: Secondary | ICD-10-CM | POA: Diagnosis not present

## 2017-07-27 LAB — HEPATITIS C ANTIBODY
Hepatitis C Ab: NONREACTIVE
SIGNAL TO CUT-OFF: 0.01 (ref <1.00)

## 2017-07-29 DIAGNOSIS — J3089 Other allergic rhinitis: Secondary | ICD-10-CM | POA: Diagnosis not present

## 2017-07-29 DIAGNOSIS — J301 Allergic rhinitis due to pollen: Secondary | ICD-10-CM | POA: Diagnosis not present

## 2017-08-05 DIAGNOSIS — J301 Allergic rhinitis due to pollen: Secondary | ICD-10-CM | POA: Diagnosis not present

## 2017-08-05 DIAGNOSIS — J3089 Other allergic rhinitis: Secondary | ICD-10-CM | POA: Diagnosis not present

## 2017-08-12 DIAGNOSIS — J3089 Other allergic rhinitis: Secondary | ICD-10-CM | POA: Diagnosis not present

## 2017-08-12 DIAGNOSIS — J301 Allergic rhinitis due to pollen: Secondary | ICD-10-CM | POA: Diagnosis not present

## 2017-08-23 DIAGNOSIS — J301 Allergic rhinitis due to pollen: Secondary | ICD-10-CM | POA: Diagnosis not present

## 2017-08-23 DIAGNOSIS — J3089 Other allergic rhinitis: Secondary | ICD-10-CM | POA: Diagnosis not present

## 2017-08-26 DIAGNOSIS — J3089 Other allergic rhinitis: Secondary | ICD-10-CM | POA: Diagnosis not present

## 2017-08-26 DIAGNOSIS — J301 Allergic rhinitis due to pollen: Secondary | ICD-10-CM | POA: Diagnosis not present

## 2017-08-28 DIAGNOSIS — J301 Allergic rhinitis due to pollen: Secondary | ICD-10-CM | POA: Diagnosis not present

## 2017-08-28 DIAGNOSIS — J3089 Other allergic rhinitis: Secondary | ICD-10-CM | POA: Diagnosis not present

## 2017-09-02 DIAGNOSIS — J301 Allergic rhinitis due to pollen: Secondary | ICD-10-CM | POA: Diagnosis not present

## 2017-09-02 DIAGNOSIS — J3089 Other allergic rhinitis: Secondary | ICD-10-CM | POA: Diagnosis not present

## 2017-09-06 DIAGNOSIS — J3089 Other allergic rhinitis: Secondary | ICD-10-CM | POA: Diagnosis not present

## 2017-09-06 DIAGNOSIS — J301 Allergic rhinitis due to pollen: Secondary | ICD-10-CM | POA: Diagnosis not present

## 2017-09-09 DIAGNOSIS — J3089 Other allergic rhinitis: Secondary | ICD-10-CM | POA: Diagnosis not present

## 2017-09-09 DIAGNOSIS — J301 Allergic rhinitis due to pollen: Secondary | ICD-10-CM | POA: Diagnosis not present

## 2017-09-17 DIAGNOSIS — J3089 Other allergic rhinitis: Secondary | ICD-10-CM | POA: Diagnosis not present

## 2017-09-17 DIAGNOSIS — J301 Allergic rhinitis due to pollen: Secondary | ICD-10-CM | POA: Diagnosis not present

## 2017-09-23 DIAGNOSIS — J3089 Other allergic rhinitis: Secondary | ICD-10-CM | POA: Diagnosis not present

## 2017-09-23 DIAGNOSIS — J301 Allergic rhinitis due to pollen: Secondary | ICD-10-CM | POA: Diagnosis not present

## 2017-10-02 DIAGNOSIS — J3089 Other allergic rhinitis: Secondary | ICD-10-CM | POA: Diagnosis not present

## 2017-10-02 DIAGNOSIS — J301 Allergic rhinitis due to pollen: Secondary | ICD-10-CM | POA: Diagnosis not present

## 2017-10-08 HISTORY — PX: LIPOSUCTION: SHX10

## 2017-10-09 DIAGNOSIS — J301 Allergic rhinitis due to pollen: Secondary | ICD-10-CM | POA: Diagnosis not present

## 2017-10-09 DIAGNOSIS — J3089 Other allergic rhinitis: Secondary | ICD-10-CM | POA: Diagnosis not present

## 2017-10-14 DIAGNOSIS — J3089 Other allergic rhinitis: Secondary | ICD-10-CM | POA: Diagnosis not present

## 2017-10-14 DIAGNOSIS — J301 Allergic rhinitis due to pollen: Secondary | ICD-10-CM | POA: Diagnosis not present

## 2017-10-21 DIAGNOSIS — J301 Allergic rhinitis due to pollen: Secondary | ICD-10-CM | POA: Diagnosis not present

## 2017-10-21 DIAGNOSIS — J3089 Other allergic rhinitis: Secondary | ICD-10-CM | POA: Diagnosis not present

## 2017-10-30 DIAGNOSIS — J301 Allergic rhinitis due to pollen: Secondary | ICD-10-CM | POA: Diagnosis not present

## 2017-10-30 DIAGNOSIS — J3089 Other allergic rhinitis: Secondary | ICD-10-CM | POA: Diagnosis not present

## 2017-11-04 DIAGNOSIS — J301 Allergic rhinitis due to pollen: Secondary | ICD-10-CM | POA: Diagnosis not present

## 2017-11-04 DIAGNOSIS — J3089 Other allergic rhinitis: Secondary | ICD-10-CM | POA: Diagnosis not present

## 2017-11-11 DIAGNOSIS — J3089 Other allergic rhinitis: Secondary | ICD-10-CM | POA: Diagnosis not present

## 2017-11-11 DIAGNOSIS — J301 Allergic rhinitis due to pollen: Secondary | ICD-10-CM | POA: Diagnosis not present

## 2017-11-18 DIAGNOSIS — J301 Allergic rhinitis due to pollen: Secondary | ICD-10-CM | POA: Diagnosis not present

## 2017-11-18 DIAGNOSIS — J3089 Other allergic rhinitis: Secondary | ICD-10-CM | POA: Diagnosis not present

## 2017-11-27 DIAGNOSIS — J301 Allergic rhinitis due to pollen: Secondary | ICD-10-CM | POA: Diagnosis not present

## 2017-11-27 DIAGNOSIS — J3089 Other allergic rhinitis: Secondary | ICD-10-CM | POA: Diagnosis not present

## 2017-12-02 DIAGNOSIS — J301 Allergic rhinitis due to pollen: Secondary | ICD-10-CM | POA: Diagnosis not present

## 2017-12-02 DIAGNOSIS — J3089 Other allergic rhinitis: Secondary | ICD-10-CM | POA: Diagnosis not present

## 2017-12-09 DIAGNOSIS — J301 Allergic rhinitis due to pollen: Secondary | ICD-10-CM | POA: Diagnosis not present

## 2017-12-09 DIAGNOSIS — J3089 Other allergic rhinitis: Secondary | ICD-10-CM | POA: Diagnosis not present

## 2017-12-16 DIAGNOSIS — J3089 Other allergic rhinitis: Secondary | ICD-10-CM | POA: Diagnosis not present

## 2017-12-16 DIAGNOSIS — J301 Allergic rhinitis due to pollen: Secondary | ICD-10-CM | POA: Diagnosis not present

## 2017-12-25 DIAGNOSIS — J301 Allergic rhinitis due to pollen: Secondary | ICD-10-CM | POA: Diagnosis not present

## 2017-12-25 DIAGNOSIS — J3089 Other allergic rhinitis: Secondary | ICD-10-CM | POA: Diagnosis not present

## 2017-12-31 DIAGNOSIS — J301 Allergic rhinitis due to pollen: Secondary | ICD-10-CM | POA: Diagnosis not present

## 2017-12-31 DIAGNOSIS — J3089 Other allergic rhinitis: Secondary | ICD-10-CM | POA: Diagnosis not present

## 2018-01-01 DIAGNOSIS — J301 Allergic rhinitis due to pollen: Secondary | ICD-10-CM | POA: Diagnosis not present

## 2018-01-02 DIAGNOSIS — J3089 Other allergic rhinitis: Secondary | ICD-10-CM | POA: Diagnosis not present

## 2018-01-02 LAB — BASIC METABOLIC PANEL
BUN: 13 (ref 4–21)
Creatinine: 0.7 (ref 0.5–1.1)
Glucose: 85
Potassium: 5.1 (ref 3.4–5.3)
Sodium: 142 (ref 137–147)

## 2018-01-02 LAB — TSH: TSH: 2.41 (ref 0.41–5.90)

## 2018-01-02 LAB — HEPATIC FUNCTION PANEL
ALT: 20 (ref 7–35)
AST: 27 (ref 13–35)
Alkaline Phosphatase: 37 (ref 25–125)
Bilirubin, Total: 0.8

## 2018-01-02 LAB — LIPID PANEL
Cholesterol: 213 — AB (ref 0–200)
HDL: 109 — AB (ref 35–70)
LDL Cholesterol: 97
Triglycerides: 34 — AB (ref 40–160)

## 2018-01-02 LAB — CBC AND DIFFERENTIAL
HCT: 38 (ref 36–46)
Hemoglobin: 12.5 (ref 12.0–16.0)
Platelets: 278 (ref 150–399)
WBC: 5.6

## 2018-01-06 DIAGNOSIS — J301 Allergic rhinitis due to pollen: Secondary | ICD-10-CM | POA: Diagnosis not present

## 2018-01-06 DIAGNOSIS — J3089 Other allergic rhinitis: Secondary | ICD-10-CM | POA: Diagnosis not present

## 2018-01-13 DIAGNOSIS — J3089 Other allergic rhinitis: Secondary | ICD-10-CM | POA: Diagnosis not present

## 2018-01-13 DIAGNOSIS — J301 Allergic rhinitis due to pollen: Secondary | ICD-10-CM | POA: Diagnosis not present

## 2018-01-15 DIAGNOSIS — J301 Allergic rhinitis due to pollen: Secondary | ICD-10-CM | POA: Diagnosis not present

## 2018-01-15 DIAGNOSIS — J3089 Other allergic rhinitis: Secondary | ICD-10-CM | POA: Diagnosis not present

## 2018-01-16 ENCOUNTER — Encounter: Payer: Self-pay | Admitting: Internal Medicine

## 2018-01-21 DIAGNOSIS — J3089 Other allergic rhinitis: Secondary | ICD-10-CM | POA: Diagnosis not present

## 2018-01-21 DIAGNOSIS — J301 Allergic rhinitis due to pollen: Secondary | ICD-10-CM | POA: Diagnosis not present

## 2018-01-27 DIAGNOSIS — J3089 Other allergic rhinitis: Secondary | ICD-10-CM | POA: Diagnosis not present

## 2018-01-27 DIAGNOSIS — J301 Allergic rhinitis due to pollen: Secondary | ICD-10-CM | POA: Diagnosis not present

## 2018-01-29 DIAGNOSIS — J301 Allergic rhinitis due to pollen: Secondary | ICD-10-CM | POA: Diagnosis not present

## 2018-01-29 DIAGNOSIS — J3089 Other allergic rhinitis: Secondary | ICD-10-CM | POA: Diagnosis not present

## 2018-02-03 DIAGNOSIS — J301 Allergic rhinitis due to pollen: Secondary | ICD-10-CM | POA: Diagnosis not present

## 2018-02-03 DIAGNOSIS — J3089 Other allergic rhinitis: Secondary | ICD-10-CM | POA: Diagnosis not present

## 2018-02-10 DIAGNOSIS — J301 Allergic rhinitis due to pollen: Secondary | ICD-10-CM | POA: Diagnosis not present

## 2018-02-10 DIAGNOSIS — J3089 Other allergic rhinitis: Secondary | ICD-10-CM | POA: Diagnosis not present

## 2018-02-17 DIAGNOSIS — J3081 Allergic rhinitis due to animal (cat) (dog) hair and dander: Secondary | ICD-10-CM | POA: Diagnosis not present

## 2018-02-17 DIAGNOSIS — J301 Allergic rhinitis due to pollen: Secondary | ICD-10-CM | POA: Diagnosis not present

## 2018-02-17 DIAGNOSIS — J3089 Other allergic rhinitis: Secondary | ICD-10-CM | POA: Diagnosis not present

## 2018-02-24 DIAGNOSIS — J301 Allergic rhinitis due to pollen: Secondary | ICD-10-CM | POA: Diagnosis not present

## 2018-02-24 DIAGNOSIS — J3089 Other allergic rhinitis: Secondary | ICD-10-CM | POA: Diagnosis not present

## 2018-03-04 DIAGNOSIS — J301 Allergic rhinitis due to pollen: Secondary | ICD-10-CM | POA: Diagnosis not present

## 2018-03-04 DIAGNOSIS — J3089 Other allergic rhinitis: Secondary | ICD-10-CM | POA: Diagnosis not present

## 2018-03-10 DIAGNOSIS — J301 Allergic rhinitis due to pollen: Secondary | ICD-10-CM | POA: Diagnosis not present

## 2018-03-10 DIAGNOSIS — J3089 Other allergic rhinitis: Secondary | ICD-10-CM | POA: Diagnosis not present

## 2018-03-17 DIAGNOSIS — J301 Allergic rhinitis due to pollen: Secondary | ICD-10-CM | POA: Diagnosis not present

## 2018-03-17 DIAGNOSIS — J3089 Other allergic rhinitis: Secondary | ICD-10-CM | POA: Diagnosis not present

## 2018-03-24 DIAGNOSIS — J301 Allergic rhinitis due to pollen: Secondary | ICD-10-CM | POA: Diagnosis not present

## 2018-03-24 DIAGNOSIS — J3089 Other allergic rhinitis: Secondary | ICD-10-CM | POA: Diagnosis not present

## 2018-03-31 DIAGNOSIS — J301 Allergic rhinitis due to pollen: Secondary | ICD-10-CM | POA: Diagnosis not present

## 2018-03-31 DIAGNOSIS — J3089 Other allergic rhinitis: Secondary | ICD-10-CM | POA: Diagnosis not present

## 2018-04-07 DIAGNOSIS — J3089 Other allergic rhinitis: Secondary | ICD-10-CM | POA: Diagnosis not present

## 2018-04-07 DIAGNOSIS — J301 Allergic rhinitis due to pollen: Secondary | ICD-10-CM | POA: Diagnosis not present

## 2018-04-14 DIAGNOSIS — J3089 Other allergic rhinitis: Secondary | ICD-10-CM | POA: Diagnosis not present

## 2018-04-14 DIAGNOSIS — J301 Allergic rhinitis due to pollen: Secondary | ICD-10-CM | POA: Diagnosis not present

## 2018-04-21 DIAGNOSIS — J301 Allergic rhinitis due to pollen: Secondary | ICD-10-CM | POA: Diagnosis not present

## 2018-04-21 DIAGNOSIS — J3089 Other allergic rhinitis: Secondary | ICD-10-CM | POA: Diagnosis not present

## 2018-04-28 DIAGNOSIS — J3089 Other allergic rhinitis: Secondary | ICD-10-CM | POA: Diagnosis not present

## 2018-04-28 DIAGNOSIS — J301 Allergic rhinitis due to pollen: Secondary | ICD-10-CM | POA: Diagnosis not present

## 2018-05-01 DIAGNOSIS — J301 Allergic rhinitis due to pollen: Secondary | ICD-10-CM | POA: Diagnosis not present

## 2018-05-01 DIAGNOSIS — J3089 Other allergic rhinitis: Secondary | ICD-10-CM | POA: Diagnosis not present

## 2018-05-05 DIAGNOSIS — J3089 Other allergic rhinitis: Secondary | ICD-10-CM | POA: Diagnosis not present

## 2018-05-05 DIAGNOSIS — J301 Allergic rhinitis due to pollen: Secondary | ICD-10-CM | POA: Diagnosis not present

## 2018-05-12 DIAGNOSIS — J301 Allergic rhinitis due to pollen: Secondary | ICD-10-CM | POA: Diagnosis not present

## 2018-05-12 DIAGNOSIS — J3089 Other allergic rhinitis: Secondary | ICD-10-CM | POA: Diagnosis not present

## 2018-05-13 DIAGNOSIS — F320 Major depressive disorder, single episode, mild: Secondary | ICD-10-CM | POA: Diagnosis not present

## 2018-05-19 DIAGNOSIS — J301 Allergic rhinitis due to pollen: Secondary | ICD-10-CM | POA: Diagnosis not present

## 2018-05-19 DIAGNOSIS — J3089 Other allergic rhinitis: Secondary | ICD-10-CM | POA: Diagnosis not present

## 2018-05-26 DIAGNOSIS — J301 Allergic rhinitis due to pollen: Secondary | ICD-10-CM | POA: Diagnosis not present

## 2018-05-26 DIAGNOSIS — J3089 Other allergic rhinitis: Secondary | ICD-10-CM | POA: Diagnosis not present

## 2018-06-02 DIAGNOSIS — J301 Allergic rhinitis due to pollen: Secondary | ICD-10-CM | POA: Diagnosis not present

## 2018-06-02 DIAGNOSIS — J3089 Other allergic rhinitis: Secondary | ICD-10-CM | POA: Diagnosis not present

## 2018-06-10 DIAGNOSIS — J3089 Other allergic rhinitis: Secondary | ICD-10-CM | POA: Diagnosis not present

## 2018-06-10 DIAGNOSIS — J301 Allergic rhinitis due to pollen: Secondary | ICD-10-CM | POA: Diagnosis not present

## 2018-06-13 DIAGNOSIS — J301 Allergic rhinitis due to pollen: Secondary | ICD-10-CM | POA: Diagnosis not present

## 2018-06-13 DIAGNOSIS — J3089 Other allergic rhinitis: Secondary | ICD-10-CM | POA: Diagnosis not present

## 2018-06-16 DIAGNOSIS — J301 Allergic rhinitis due to pollen: Secondary | ICD-10-CM | POA: Diagnosis not present

## 2018-06-16 DIAGNOSIS — J3089 Other allergic rhinitis: Secondary | ICD-10-CM | POA: Diagnosis not present

## 2018-06-18 DIAGNOSIS — J301 Allergic rhinitis due to pollen: Secondary | ICD-10-CM | POA: Diagnosis not present

## 2018-06-18 DIAGNOSIS — J3089 Other allergic rhinitis: Secondary | ICD-10-CM | POA: Diagnosis not present

## 2018-06-23 DIAGNOSIS — J3089 Other allergic rhinitis: Secondary | ICD-10-CM | POA: Diagnosis not present

## 2018-06-23 DIAGNOSIS — J301 Allergic rhinitis due to pollen: Secondary | ICD-10-CM | POA: Diagnosis not present

## 2018-06-30 DIAGNOSIS — J301 Allergic rhinitis due to pollen: Secondary | ICD-10-CM | POA: Diagnosis not present

## 2018-06-30 DIAGNOSIS — J3089 Other allergic rhinitis: Secondary | ICD-10-CM | POA: Diagnosis not present

## 2018-07-07 DIAGNOSIS — J301 Allergic rhinitis due to pollen: Secondary | ICD-10-CM | POA: Diagnosis not present

## 2018-07-07 DIAGNOSIS — J3089 Other allergic rhinitis: Secondary | ICD-10-CM | POA: Diagnosis not present

## 2018-07-14 DIAGNOSIS — J3089 Other allergic rhinitis: Secondary | ICD-10-CM | POA: Diagnosis not present

## 2018-07-14 DIAGNOSIS — J301 Allergic rhinitis due to pollen: Secondary | ICD-10-CM | POA: Diagnosis not present

## 2018-07-18 DIAGNOSIS — J301 Allergic rhinitis due to pollen: Secondary | ICD-10-CM | POA: Diagnosis not present

## 2018-07-18 DIAGNOSIS — J3089 Other allergic rhinitis: Secondary | ICD-10-CM | POA: Diagnosis not present

## 2018-07-18 DIAGNOSIS — R0602 Shortness of breath: Secondary | ICD-10-CM | POA: Diagnosis not present

## 2018-07-18 DIAGNOSIS — H1045 Other chronic allergic conjunctivitis: Secondary | ICD-10-CM | POA: Diagnosis not present

## 2018-07-21 DIAGNOSIS — J3089 Other allergic rhinitis: Secondary | ICD-10-CM | POA: Diagnosis not present

## 2018-07-21 DIAGNOSIS — J301 Allergic rhinitis due to pollen: Secondary | ICD-10-CM | POA: Diagnosis not present

## 2018-07-28 DIAGNOSIS — J301 Allergic rhinitis due to pollen: Secondary | ICD-10-CM | POA: Diagnosis not present

## 2018-07-28 DIAGNOSIS — J3089 Other allergic rhinitis: Secondary | ICD-10-CM | POA: Diagnosis not present

## 2018-08-04 DIAGNOSIS — J3089 Other allergic rhinitis: Secondary | ICD-10-CM | POA: Diagnosis not present

## 2018-08-04 DIAGNOSIS — J301 Allergic rhinitis due to pollen: Secondary | ICD-10-CM | POA: Diagnosis not present

## 2018-08-11 DIAGNOSIS — J3089 Other allergic rhinitis: Secondary | ICD-10-CM | POA: Diagnosis not present

## 2018-08-11 DIAGNOSIS — J301 Allergic rhinitis due to pollen: Secondary | ICD-10-CM | POA: Diagnosis not present

## 2018-08-18 DIAGNOSIS — J301 Allergic rhinitis due to pollen: Secondary | ICD-10-CM | POA: Diagnosis not present

## 2018-08-18 DIAGNOSIS — J3089 Other allergic rhinitis: Secondary | ICD-10-CM | POA: Diagnosis not present

## 2018-08-22 DIAGNOSIS — J3089 Other allergic rhinitis: Secondary | ICD-10-CM | POA: Diagnosis not present

## 2018-08-22 DIAGNOSIS — J301 Allergic rhinitis due to pollen: Secondary | ICD-10-CM | POA: Diagnosis not present

## 2018-08-25 DIAGNOSIS — J3089 Other allergic rhinitis: Secondary | ICD-10-CM | POA: Diagnosis not present

## 2018-08-25 DIAGNOSIS — J301 Allergic rhinitis due to pollen: Secondary | ICD-10-CM | POA: Diagnosis not present

## 2018-08-28 NOTE — Patient Instructions (Addendum)
ekg is normal.  We can order ct calcium scan for risk assessment  As we discussed  If you wish     Preventive Care 40-64 Years, Female Preventive care refers to lifestyle choices and visits with your health care provider that can promote health and wellness. What does preventive care include?  A yearly physical exam. This is also called an annual well check.  Dental exams once or twice a year.  Routine eye exams. Ask your health care provider how often you should have your eyes checked.  Personal lifestyle choices, including: ? Daily care of your teeth and gums. ? Regular physical activity. ? Eating a healthy diet. ? Avoiding tobacco and drug use. ? Limiting alcohol use. ? Practicing safe sex. ? Taking low-dose aspirin daily starting at age 39. ? Taking vitamin and mineral supplements as recommended by your health care provider. What happens during an annual well check? The services and screenings done by your health care provider during your annual well check will depend on your age, overall health, lifestyle risk factors, and family history of disease. Counseling Your health care provider may ask you questions about your:  Alcohol use.  Tobacco use.  Drug use.  Emotional well-being.  Home and relationship well-being.  Sexual activity.  Eating habits.  Work and work Statistician.  Method of birth control.  Menstrual cycle.  Pregnancy history.  Screening You may have the following tests or measurements:  Height, weight, and BMI.  Blood pressure.  Lipid and cholesterol levels. These may be checked every 5 years, or more frequently if you are over 70 years old.  Skin check.  Lung cancer screening. You may have this screening every year starting at age 49 if you have a 30-pack-year history of smoking and currently smoke or have quit within the past 15 years.  Fecal occult blood test (FOBT) of the stool. You may have this test every year starting at age  6.  Flexible sigmoidoscopy or colonoscopy. You may have a sigmoidoscopy every 5 years or a colonoscopy every 10 years starting at age 27.  Hepatitis C blood test.  Hepatitis B blood test.  Sexually transmitted disease (STD) testing.  Diabetes screening. This is done by checking your blood sugar (glucose) after you have not eaten for a while (fasting). You may have this done every 1-3 years.  Mammogram. This may be done every 1-2 years. Talk to your health care provider about when you should start having regular mammograms. This may depend on whether you have a family history of breast cancer.  BRCA-related cancer screening. This may be done if you have a family history of breast, ovarian, tubal, or peritoneal cancers.  Pelvic exam and Pap test. This may be done every 3 years starting at age 41. Starting at age 35, this may be done every 5 years if you have a Pap test in combination with an HPV test.  Bone density scan. This is done to screen for osteoporosis. You may have this scan if you are at high risk for osteoporosis.  Discuss your test results, treatment options, and if necessary, the need for more tests with your health care provider. Vaccines Your health care provider may recommend certain vaccines, such as:  Influenza vaccine. This is recommended every year.  Tetanus, diphtheria, and acellular pertussis (Tdap, Td) vaccine. You may need a Td booster every 10 years.  Varicella vaccine. You may need this if you have not been vaccinated.  Zoster vaccine. You may need  this after age 58.  Measles, mumps, and rubella (MMR) vaccine. You may need at least one dose of MMR if you were born in 1957 or later. You may also need a second dose.  Pneumococcal 13-valent conjugate (PCV13) vaccine. You may need this if you have certain conditions and were not previously vaccinated.  Pneumococcal polysaccharide (PPSV23) vaccine. You may need one or two doses if you smoke cigarettes or if you  have certain conditions.  Meningococcal vaccine. You may need this if you have certain conditions.  Hepatitis A vaccine. You may need this if you have certain conditions or if you travel or work in places where you may be exposed to hepatitis A.  Hepatitis B vaccine. You may need this if you have certain conditions or if you travel or work in places where you may be exposed to hepatitis B.  Haemophilus influenzae type b (Hib) vaccine. You may need this if you have certain conditions.  Talk to your health care provider about which screenings and vaccines you need and how often you need them. This information is not intended to replace advice given to you by your health care provider. Make sure you discuss any questions you have with your health care provider. Document Released: 10/21/2015 Document Revised: 06/13/2016 Document Reviewed: 07/26/2015 Elsevier Interactive Patient Education  [employer].

## 2018-08-28 NOTE — Progress Notes (Signed)
Chief Complaint  Patient presents with  . Annual Exam    Candidate for Cardiac Calcium score?? Pt has occassional chest pain x 2 years.     HPI: Patient  Tracey Patrick  62 y.o. comes in today for [place] visit  fam hx is neg .   Lipids favorable  But elevated  To have gyne and mammo  Gets some short  Mid cp no assoc sx under some stress but no tremor  No exercise intolerance had similar sx for year and had neg eval per dr Verl Blalock    No palpitations syncope radiation  Has elevated hdl ask a bout risk assessment   Health Maintenance  Topic Date Due  . HIV Screening  12/28/1970  . MAMMOGRAM  11/07/2018 (Originally 12/19/2017)  . COLONOSCOPY  11/20/2018 (Originally 08/08/2018)  . TETANUS/TDAP  09/24/2018  . PAP SMEAR  01/05/2019  . INFLUENZA VACCINE  Completed  . Hepatitis C Screening  Completed   Health Maintenance Review LIFESTYLE:  Exercise:  Doing  180 week cardio and strength .  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 GI /GU: No adominal pain, vomiting, change in bowel habits. No blood in the stool. No significant GU symptoms. SKIN/HEME: ,no acute skin rashes suspicious lesions or bleeding. No lymphadenopathy, nodules, masses.  NEURO/ PSYCH:  No neurologic signs such as weakness numbness. No depression anxiety. IMM/ Allergy: No unusual infections.  Allergy .   REST of 12 system review negative except as per HPI   Past Medical History:  Diagnosis Date  . CHEST PAIN, ATYPICAL 09/24/2008   Qualifier: Diagnosis of  By: Hulan Saas, CMA (AAMA), [place], HX OF 10/04/2008   Qualifier: Diagnosis of  By: Regis Bill MD, Standley Brooking   .  Desensitization to allergy shot    van w[place]  2016  . Endometriosis   . ENDOMETRIOSIS 09/24/2008   Qualifier: Diagnosis of  By: Hulan Saas, CMA (AAMA), [place]   . HEMORRHOIDS 09/24/2008   Qualifier: Diagnosis of  By: Hulan Saas, CMA (AAMA), [place] Skye   . SCOLIOSIS 09/24/2008   Qualifier: Diagnosis of  By: Hulan Saas, CMA (AAMA), [place] Skye     Past Surgical History:  Procedure Laterality Date  . ENDOMETRIAL ABLATION  2004  . OSTEOTOMY  1984   for overbite   maxillary     Family History  Problem Relation Age of Onset  . Breast cancer Mother        died mva age 9  . Macular degeneration Father   . CVA Father        felt secondary to Vioxx  . Hyperlipidemia Father   . Hyperlipidemia Brother        3 brothes   . Alcohol abuse Paternal Grandfather     Social History   Socioeconomic History  . Marital status: Married    Spouse name: Not on file  . Number of children: Not on file  . Years of education: Not on file  . Highest education level: Not on file  Occupational History  . Not on file  Social Needs  . Financial resource strain: Not on file  .  Food insecurity:    Worry: Not on file    Inability: Not on file  . Transportation needs:    Medical: Not on file    Non-medical: Not on file  Tobacco Use  . Smoking status: Never Smoker  . Smokeless tobacco: Never Used  Substance and Sexual Activity  . Alcohol use: Yes    Alcohol/week: 6.0 standard drinks    Types: 6 Glasses of wine per week    Comment: wine nightly  . Drug use: No  . Sexual activity: Not on file  Lifestyle  . Physical activity:    Days per week: Not on file    Minutes per session: Not on file  . Stress: Not on file  Relationships  . Social connections:    Talks on phone: Not on file    Gets together: Not on file    Attends religious service: Not on file    Active member of club or organization: Not on file    Attends meetings of clubs or organizations: Not on file    Relationship status: Not  on file  Other Topics Concern  . Not on file  Social History Narrative   hh of 2  Married    MD opthalmologist  Never tobacco    G1P0    Outpatient Medications Prior to Visit  Medication Sig Dispense Refill  . CALCIUM CARBONATE-VIT D-MIN PO Take by mouth. 500 MG OF CALCIUM    . CALCIUM PO Take 200 mg by mouth 2 (two) times daily.     Marland Kitchen co-enzyme Q-10 50 MG capsule Take 50 mg by mouth daily.    . cycloSPORINE (RESTASIS) 0.05 % ophthalmic emulsion Place 1 drop into both eyes 2 (two) times daily.    Marland Kitchen docusate sodium (COLACE) 100 MG capsule Take 100 mg by mouth daily.     . Ferrous Fumarate (IRON) 18 MG TBCR Take by mouth.    . Garlic 354 MG CAPS Take 1 capsule by mouth daily.    Javier Docker Oil 500 MG CAPS Take 1 capsule by mouth daily.    . Lutein 20 MG CAPS Take 20 mg by mouth daily.    . Multiple Vitamin (MULTIVITAMIN) tablet Take 1 tablet by mouth daily.    . Omega-3 Fatty Acids (FISH OIL PO) Take 1 g by mouth daily.    Marland Kitchen OVER THE COUNTER MEDICATION NATTO KINASE - 50 MG DAILY    . OVER THE COUNTER MEDICATION ZEAXANTHIN - 2 MG DAILY    . PRESCRIPTION MEDICATION ALLERGY SHOTS    . Psyllium (METAMUCIL FIBER PO) Take 20 g by mouth daily.    . Red Yeast Rice Extract (RED YEAST RICE PO) Take 600 mg by mouth daily.    Addison Lank Hazel (PREPARATION H EX) Apply topically daily.    . hydrocortisone (ANUSOL-HC) 25 MG suppository Place 1 suppository (25 mg total) rectally at bedtime. (Patient not taking: Reported on 08/29/2018) 12 suppository 2  . minocycline (DYNACIN) 50 MG tablet Take 50 mg by mouth 2 (two) times daily. FOR SKIN BREAKOUTS    . Resveratrol 100 MG CAPS Take 100 mg by mouth daily.     No facility-administered medications prior to visit.      EXAM:  BP 102/64 (BP Location: Right Arm, Patient Position: Sitting, Cuff Size: Normal)   Pulse 64   Temp 97.6 F (36.4 C) (Oral)   Ht 5' 1.75" (1.568 m)   Wt 109 lb 9.6 oz (49.7 kg)   BMI  20.21 kg/m   Body mass index is 20.21  kg/m. Wt Readings from Last 3 Encounters:  08/29/18 109 lb 9.6 oz (49.7 kg)  04/12/17 110 lb (49.9 kg)  10/28/13 109 lb 3.2 oz (49.5 kg)    Physical Exam: Vital signs reviewed TTC:NGFR is a well-developed well-nourished alert cooperative    who appearsr stated age in no acute distress.  HEENT: normocephalic atraumatic , Eyes: PERRL EOM's full, conjunctiva clear, Nares: paten,t no deformity discharge or tenderness., Ears: no deformity EAC's clear TMs with normal landmarks. Mouth: clear OP, no lesions, edema.  Moist mucous membranes. Dentition in adequate repair. NECK: supple without masses, thyromegaly or bruits. CHEST/PULM:  Clear to auscultation and percussion breath sounds equal no wheeze , rales or rhonchi. No chest wall deformities  No pectus or tenderness. Breast: per gyne deferred  CV: PMI is nondisplaced, S1 S2 no gallops, murmurs, rubs. Peripheral pulses are full without delay.No JVD .  ABDOMEN: Bowel sounds normal nontender  No guard or rebound, no hepato splenomegal no CVA tenderness.   Extremtities:  No clubbing cyanosis or edema, no acute joint swelling or redness no focal atrophy NEURO:  Oriented x3, cranial nerves 3-12 appear to be intact, no obvious focal weakness,gait within normal limits no abnormal reflexes or asymmetrical SKIN: No acute rashes normal turgor, color, no bruising or petechiae. PSYCH: Oriented, good eye contact, no obvious depression anxiety, cognition and judgment appear normal. LN: no cervical axillary inguinal adenopathy  Lab Results  Component Value Date   WBC 5.6 01/02/2018   HGB 12.5 01/02/2018   HCT 38 01/02/2018   PLT 278 01/02/2018   GLUCOSE 93 09/24/2008   CHOL 213 (A) 01/02/2018   TRIG 34 (A) 01/02/2018   HDL 109 (A) 01/02/2018   LDLDIRECT 109.2 09/24/2008   LDLCALC 97 01/02/2018   ALT 20 01/02/2018   AST 27 01/02/2018   NA 142 01/02/2018   K 5.1 01/02/2018   CL 106 09/24/2008   CREATININE 0.7 01/02/2018   BUN 13 01/02/2018   CO2 28  09/24/2008   TSH 2.41 01/02/2018    BP Readings from Last 3 Encounters:  08/29/18 102/64  04/12/17 104/62  10/28/13 132/68  EKG NSR nl intervals  Loletha Grayer   Lab results reviewed with patient   ASSESSMENT AND PLAN:  Discussed the following assessment and plan:  Visit for preventive health examination  Medication management  Hyperlipidemia, unspecified hyperlipidemia type - favorable ratios - Plan: EKG 12-Lead, CT CARDIAC SCORING  Screening for cardiovascular condition - Plan: EKG 12-Lead, CT CARDIAC SCORING  Cardiovascular risk factor - Plan: CT CARDIAC SCORING  Atypical  chest sx under stress but no assoc sx and good exercise tolerance  Nl ekg  elevated lipids but no fam hx of premature disease ( father mi in 71s felt from celebrex?)   Has had  Prev eval by dr wall years ago and felt to be no sig cv   Has scoliosis and  Poss related     disc  microvascular ischemia doubt . Not typical   revewied   Testing ok to  Proceed with ct score Patient Care Team: Burnis Medin, MD as PCP - General (Internal Medicine) Richmond Campbell, MD as Consulting Physician (Gastroenterology) Dian Queen, MD as Consulting Physician (Obstetrics and Gynecology) Patient Instructions  ekg is normal.  We can order ct calcium scan for risk assessment  As we discussed  If you wish     Preventive Care 40-64 Years, Female Preventive care refers to lifestyle  choices and visits with your health care provider that can promote health and wellness. What does preventive care include?  A yearly physical exam. This is also called an annual well check.  Dental exams once or twice a year.  Routine eye exams. Ask your health care provider how often you should have your eyes checked.  Personal lifestyle choices, including: ? Daily care of your teeth and gums. ? Regular physical activity. ? Eating a healthy diet. ? Avoiding tobacco and drug use. ? Limiting alcohol use. ? Practicing safe sex. ? Taking  low-dose aspirin daily starting at age 58. ? Taking vitamin and mineral supplements as recommended by your health care provider. What happens during an annual well check? The services and screenings done by your health care provider during your annual well check will depend on your age, overall health, lifestyle risk factors, and family history of disease. Counseling Your health care provider may ask you questions about your:  Alcohol use.  Tobacco use.  Drug use.  Emotional well-being.  Home and relationship well-being.  Sexual activity.  Eating habits.  Work and work Statistician.  Method of birth control.  Menstrual cycle.  Pregnancy history.  Screening You may have the following tests or measurements:  Height, weight, and BMI.  Blood pressure.  Lipid and cholesterol levels. These may be checked every 5 years, or more frequently if you are over 90 years old.  Skin check.  Lung cancer screening. You may have this screening every year starting at age 61 if you have a 30-pack-year history of smoking and currently smoke or have quit within the past 15 years.  Fecal occult blood test (FOBT) of the stool. You may have this test every year starting at age 56.  Flexible sigmoidoscopy or colonoscopy. You may have a sigmoidoscopy every 5 years or a colonoscopy every 10 years starting at age 59.  Hepatitis C blood test.  Hepatitis B blood test.  Sexually transmitted disease (STD) testing.  Diabetes screening. This is done by checking your blood sugar (glucose) after you have not eaten for a while (fasting). You may have this done every 1-3 years.  Mammogram. This may be done every 1-2 years. Talk to your health care provider about when you should start having regular mammograms. This may depend on whether you have a family history of breast cancer.  BRCA-related cancer screening. This may be done if you have a family history of breast, ovarian, tubal, or peritoneal  cancers.  Pelvic exam and Pap test. This may be done every 3 years starting at age 15. Starting at age 33, this may be done every 5 years if you have a Pap test in combination with an HPV test.  Bone density scan. This is done to screen for osteoporosis. You may have this scan if you are at high risk for osteoporosis.  Discuss your test results, treatment options, and if necessary, the need for more tests with your health care provider. Vaccines Your health care provider may recommend certain vaccines, such as:  Influenza vaccine. This is recommended every year.  Tetanus, diphtheria, and acellular pertussis (Tdap, Td) vaccine. You may need a Td booster every 10 years.  Varicella vaccine. You may need this if you have not been vaccinated.  Zoster vaccine. You may need this after age 49.  Measles, mumps, and rubella (MMR) vaccine. You may need at least one dose of MMR if you were born in 1957 or later. You may also need a second  dose.  Pneumococcal 13-valent conjugate (PCV13) vaccine. You may need this if you have certain conditions and were not previously vaccinated.  Pneumococcal polysaccharide (PPSV23) vaccine. You may need one or two doses if you smoke cigarettes or if you have certain conditions.  Meningococcal vaccine. You may need this if you have certain conditions.  Hepatitis A vaccine. You may need this if you have certain conditions or if you travel or work in places where you may be exposed to hepatitis A.  Hepatitis B vaccine. You may need this if you have certain conditions or if you travel or work in places where you may be exposed to hepatitis B.  Haemophilus influenzae type b (Hib) vaccine. You may need this if you have certain conditions.  Talk to your health care provider about which screenings and vaccines you need and how often you need them. This information is not intended to replace advice given to you by your health care provider. Make sure you discuss any  questions you have with your health care provider. Document Released: 10/21/2015 Document Revised: 06/13/2016 Document Reviewed: 07/26/2015 Elsevier Interactive Patient Education  2018 [employer]ensed. Panosh M.D.

## 2018-08-29 ENCOUNTER — Encounter: Payer: Self-pay | Admitting: Internal Medicine

## 2018-08-29 ENCOUNTER — Ambulatory Visit (INDEPENDENT_AMBULATORY_CARE_PROVIDER_SITE_OTHER): Payer: BLUE CROSS/BLUE SHIELD | Admitting: Internal Medicine

## 2018-08-29 VITALS — BP 102/64 | HR 64 | Temp 97.6°F | Ht 61.75 in | Wt 109.6 lb

## 2018-08-29 DIAGNOSIS — E785 Hyperlipidemia, unspecified: Secondary | ICD-10-CM

## 2018-08-29 DIAGNOSIS — J3089 Other allergic rhinitis: Secondary | ICD-10-CM | POA: Diagnosis not present

## 2018-08-29 DIAGNOSIS — Z136 Encounter for screening for cardiovascular disorders: Secondary | ICD-10-CM

## 2018-08-29 DIAGNOSIS — Z0000 Encounter for general adult medical examination without abnormal findings: Secondary | ICD-10-CM

## 2018-08-29 DIAGNOSIS — J301 Allergic rhinitis due to pollen: Secondary | ICD-10-CM | POA: Diagnosis not present

## 2018-08-29 DIAGNOSIS — Z79899 Other long term (current) drug therapy: Secondary | ICD-10-CM

## 2018-08-29 DIAGNOSIS — Z9189 Other specified personal risk factors, not elsewhere classified: Secondary | ICD-10-CM

## 2018-09-01 DIAGNOSIS — J301 Allergic rhinitis due to pollen: Secondary | ICD-10-CM | POA: Diagnosis not present

## 2018-09-01 DIAGNOSIS — J3089 Other allergic rhinitis: Secondary | ICD-10-CM | POA: Diagnosis not present

## 2018-09-08 DIAGNOSIS — J3089 Other allergic rhinitis: Secondary | ICD-10-CM | POA: Diagnosis not present

## 2018-09-08 DIAGNOSIS — J301 Allergic rhinitis due to pollen: Secondary | ICD-10-CM | POA: Diagnosis not present

## 2018-09-09 ENCOUNTER — Telehealth: Payer: Self-pay

## 2018-09-09 NOTE — Telephone Encounter (Signed)
Copied from CRM [id number]. Topic: General - Other >> Sep 09, 2018  1:52 PM Laural BenesJohnson, Louisianahiquita C wrote: Reason for CRM: pt called in stating that she is returning Deborah's call to follow up on pt's calcium CT scan being set up. Pt would like to make her aware that her apt is on 09/26/18 at 2:30p.

## 2018-09-15 DIAGNOSIS — J3089 Other allergic rhinitis: Secondary | ICD-10-CM | POA: Diagnosis not present

## 2018-09-15 DIAGNOSIS — J301 Allergic rhinitis due to pollen: Secondary | ICD-10-CM | POA: Diagnosis not present

## 2018-09-16 NOTE — Telephone Encounter (Signed)
Noted/aware

## 2018-09-22 DIAGNOSIS — J301 Allergic rhinitis due to pollen: Secondary | ICD-10-CM | POA: Diagnosis not present

## 2018-09-22 DIAGNOSIS — J3089 Other allergic rhinitis: Secondary | ICD-10-CM | POA: Diagnosis not present

## 2018-09-24 ENCOUNTER — Other Ambulatory Visit: Payer: BLUE CROSS/BLUE SHIELD

## 2018-09-26 ENCOUNTER — Ambulatory Visit (INDEPENDENT_AMBULATORY_CARE_PROVIDER_SITE_OTHER)
Admission: RE | Admit: 2018-09-26 | Discharge: 2018-09-26 | Disposition: A | Discharge status: Home / Self Care | Payer: Self-pay | Source: Ambulatory Visit | Attending: Internal Medicine | Admitting: Internal Medicine

## 2018-09-26 DIAGNOSIS — E785 Hyperlipidemia, unspecified: Secondary | ICD-10-CM

## 2018-09-26 DIAGNOSIS — Z9189 Other specified personal risk factors, not elsewhere classified: Secondary | ICD-10-CM

## 2018-09-26 DIAGNOSIS — Z136 Encounter for screening for cardiovascular disorders: Secondary | ICD-10-CM

## 2018-09-26 IMAGING — CT CT HEART SCORING
2 series · 16 of 20 positions shown, 18 images · non-contrast
Comparison: None.

Addendum:
EXAM:
OVER-READ INTERPRETATION  CT CHEST

The following report is an over-read performed by radiologist Dr.
ABRAHAM [REDACTED] on [DATE]. This
over-read does not include interpretation of cardiac or coronary
anatomy or pathology. The coronary calcium score interpretation by
the cardiologist is attached.
CLINICAL DATA: Risk stratification
Coronary Calcium Score
TECHNIQUE: The patient was scanned on a Siemens Force scanner. Axial
non-contrast 3 mm slices were carried out through the heart. The
data set was analyzed on a dedicated work station and scored using
the Agatson method.

[Series 3: casc 3.0 i36f 2 bestdiast 69 % · axial · 0.28mm/px · z∈[+1424,+1520]mm · 8 of 42 slices shown, 10 images]
[im 5/42  vessel]
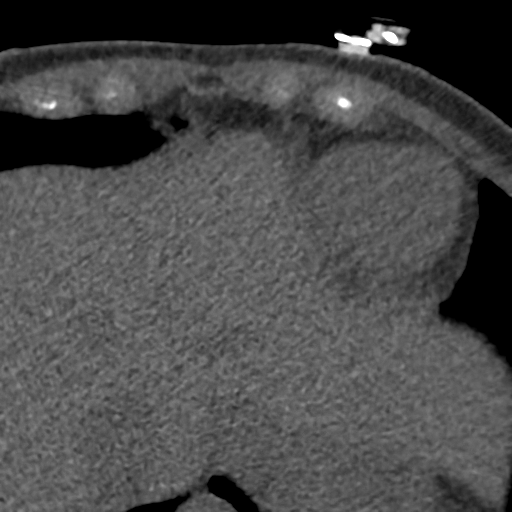
[im 5/42  lung]
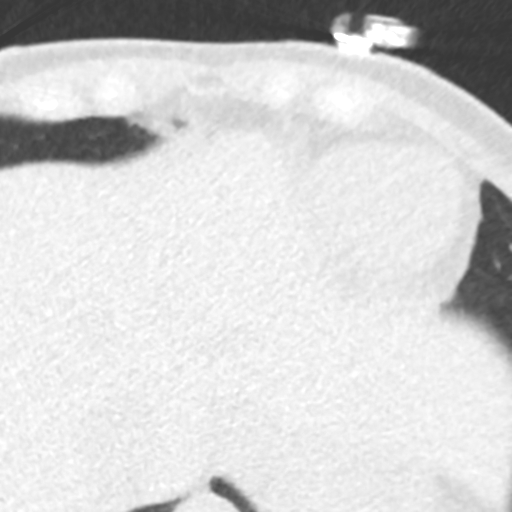
[im 10/42  vessel]
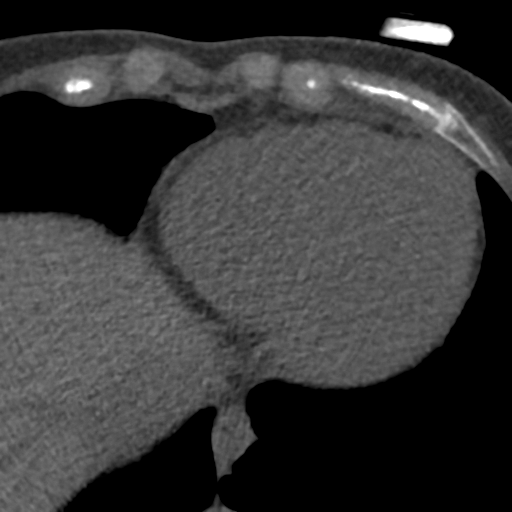
[im 14/42  vessel]
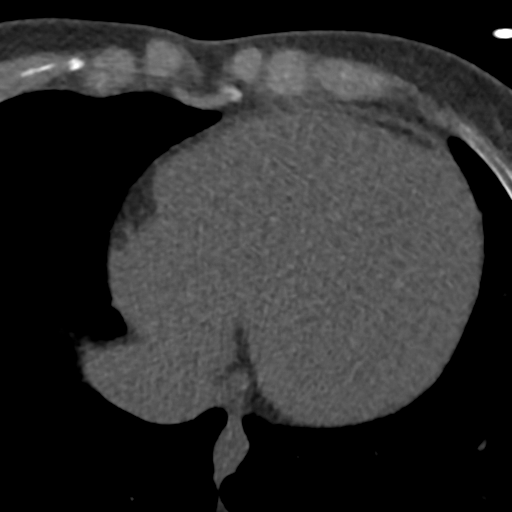
[im 19/42  vessel]
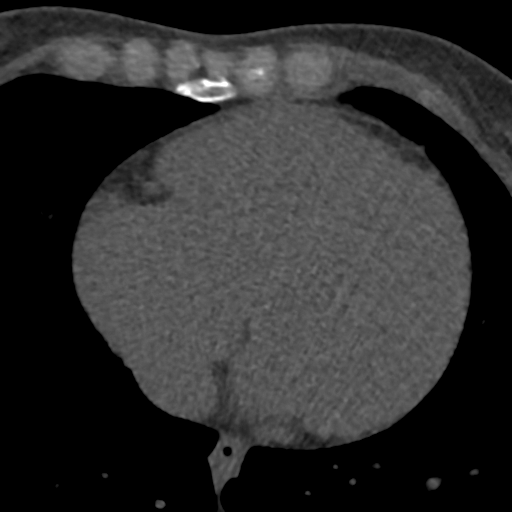
[im 23/42  vessel]
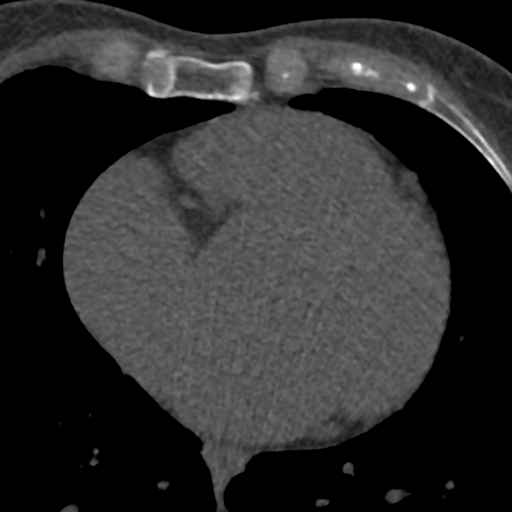
[im 23/42  lung]
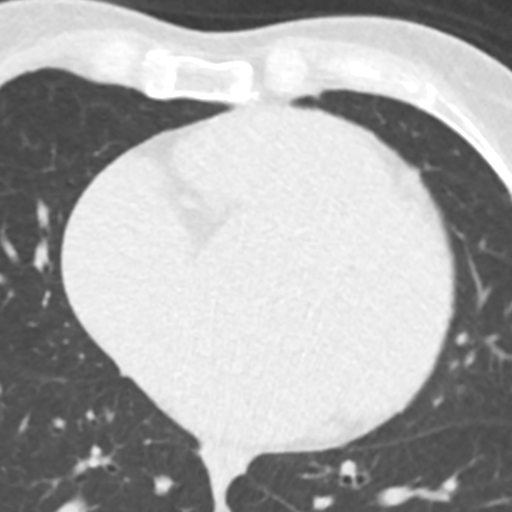
[im 28/42  vessel]
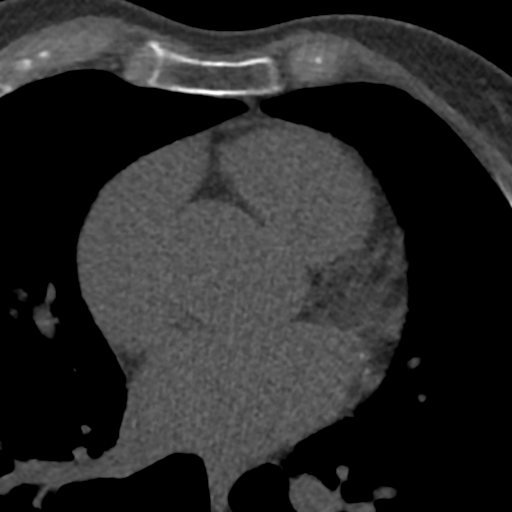
[im 32/42  vessel]
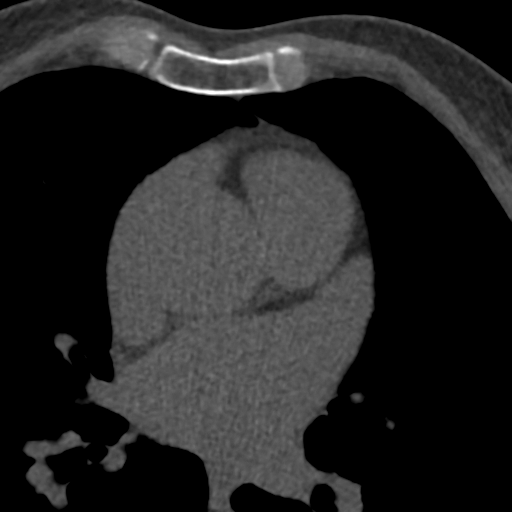
[im 37/42  vessel]
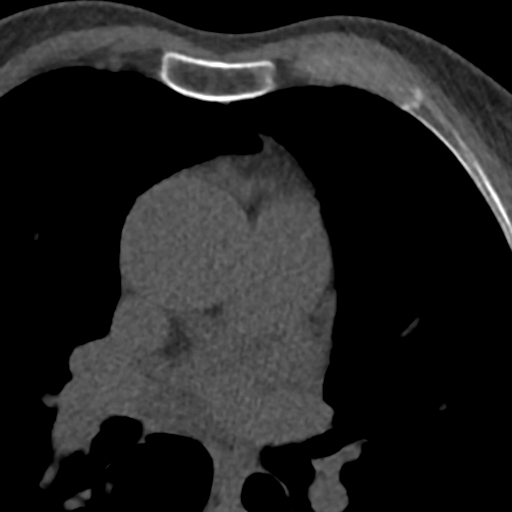

[Series 5: lung st 70 % · axial · 0.52mm/px · z∈[+1422,+1521]mm · 8 of 43 slices shown]
[im 5/43  lung]
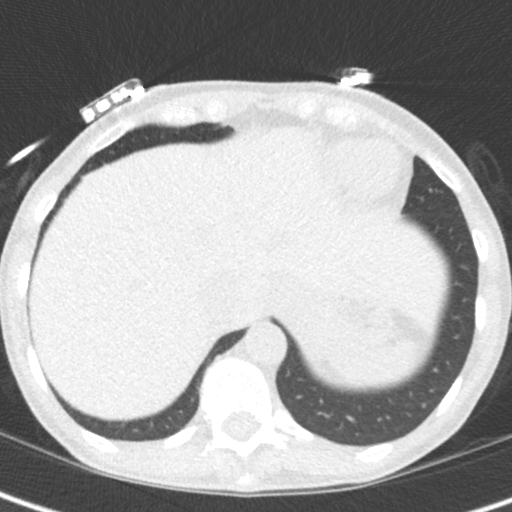
[im 10/43  lung]
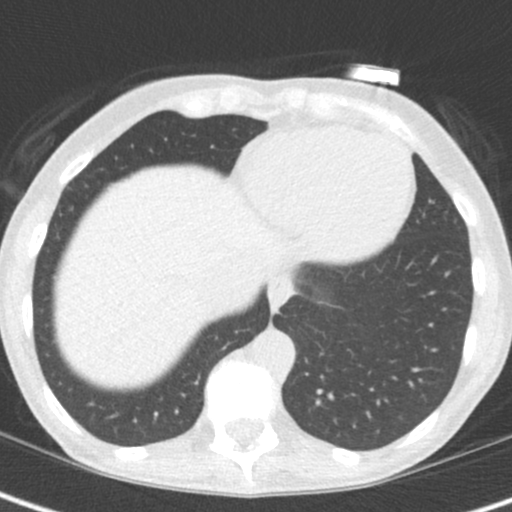
[im 15/43  lung]
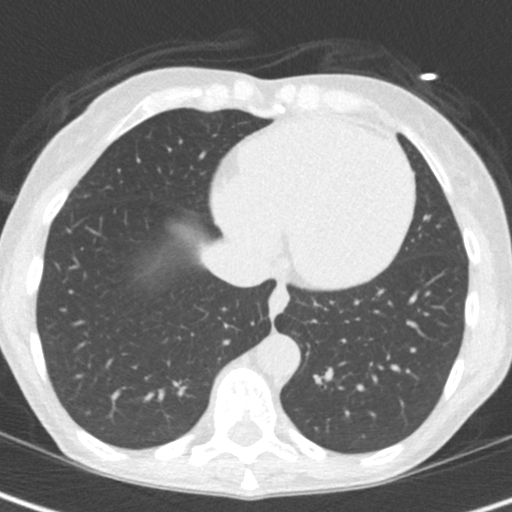
[im 19/43  lung]
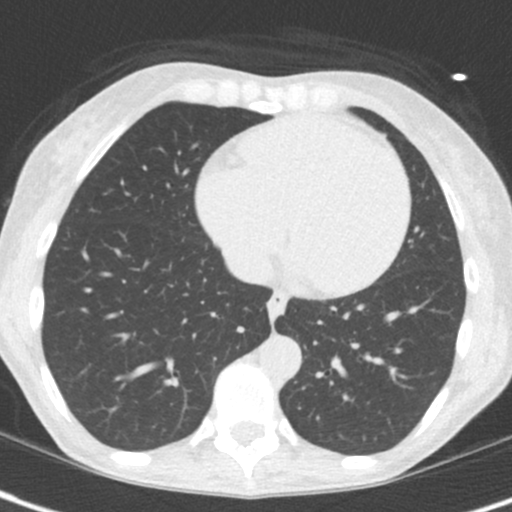
[im 24/43  lung]
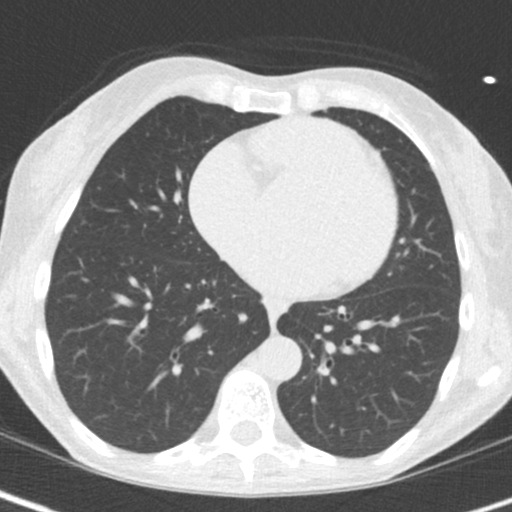
[im 29/43  lung]
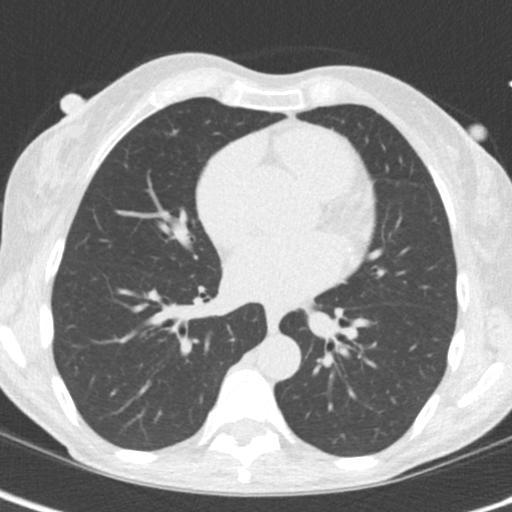
[im 33/43  lung]
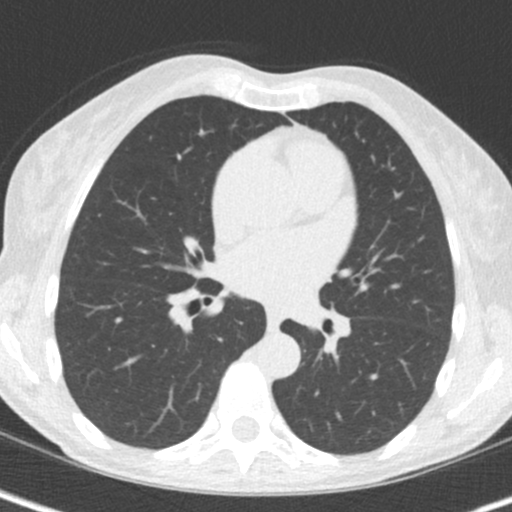
[im 38/43  lung]
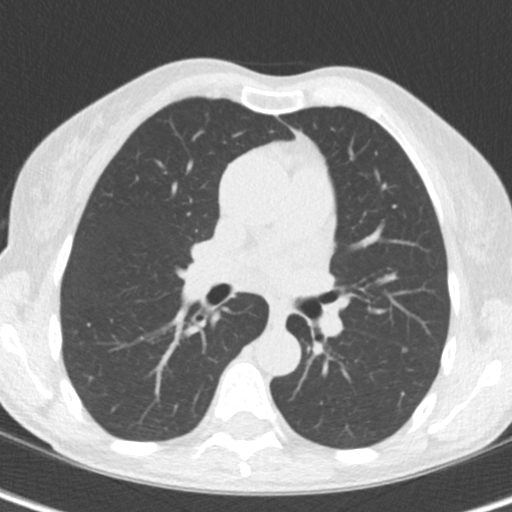

[16 of 20 positions shown; findings below may reference images not displayed]

FINDINGS: Vascular: Heart is normal size.  Visualized aorta is normal caliber.

Mediastinum/Nodes: No adenopathy in the lower mediastinum or hila.

Lungs/Pleura: Visualized lungs clear.  No effusions.

Upper Abdomen: Imaging into the upper abdomen shows no acute
findings.

Musculoskeletal: Chest wall soft tissues are unremarkable. No acute
bony abnormality.
IMPRESSION: No acute or significant extracardiac abnormality.
FINDINGS: Non-cardiac: See separate report from [REDACTED].

Ascending Aorta: Normal size, no calcifications.

Pericardium: Normal.

Coronary arteries: Normal origin.
IMPRESSION: Coronary calcium score of 4. This was 74 percentile for age and sex
matched control.

*** End of Addendum ***

## 2018-09-29 DIAGNOSIS — J3089 Other allergic rhinitis: Secondary | ICD-10-CM | POA: Diagnosis not present

## 2018-09-29 DIAGNOSIS — J301 Allergic rhinitis due to pollen: Secondary | ICD-10-CM | POA: Diagnosis not present

## 2018-10-06 DIAGNOSIS — J301 Allergic rhinitis due to pollen: Secondary | ICD-10-CM | POA: Diagnosis not present

## 2018-10-06 DIAGNOSIS — J3089 Other allergic rhinitis: Secondary | ICD-10-CM | POA: Diagnosis not present

## 2018-10-06 NOTE — Telephone Encounter (Signed)
Please advise on which medication you would like her to start. Thank you,  Chapman MossMadison N., RMA

## 2018-10-10 ENCOUNTER — Other Ambulatory Visit: Payer: Self-pay | Admitting: Internal Medicine

## 2018-10-10 DIAGNOSIS — E785 Hyperlipidemia, unspecified: Secondary | ICD-10-CM

## 2018-10-10 MED ORDER — ROSUVASTATIN CALCIUM 10 MG PO TABS: 10.0000 mg | ORAL_TABLET | Freq: Every day | ORAL | 5 refills | Status: DC

## 2018-10-10 NOTE — Telephone Encounter (Signed)
Please send in crestor 10 mg take 1 po qd  Disp 30 refill x 5   Check lipid panel in   3-4 months ( no office visit needed )

## 2018-10-13 DIAGNOSIS — J301 Allergic rhinitis due to pollen: Secondary | ICD-10-CM | POA: Diagnosis not present

## 2018-10-13 DIAGNOSIS — J3089 Other allergic rhinitis: Secondary | ICD-10-CM | POA: Diagnosis not present

## 2018-10-17 DIAGNOSIS — Z01419 Encounter for gynecological examination (general) (routine) without abnormal findings: Secondary | ICD-10-CM | POA: Diagnosis not present

## 2018-10-17 DIAGNOSIS — Z6820 Body mass index (BMI) 20.0-20.9, adult: Secondary | ICD-10-CM | POA: Diagnosis not present

## 2018-10-17 DIAGNOSIS — Z1231 Encounter for screening mammogram for malignant neoplasm of breast: Secondary | ICD-10-CM | POA: Diagnosis not present

## 2018-10-17 LAB — HM MAMMOGRAPHY

## 2018-10-20 DIAGNOSIS — J3089 Other allergic rhinitis: Secondary | ICD-10-CM | POA: Diagnosis not present

## 2018-10-20 DIAGNOSIS — J301 Allergic rhinitis due to pollen: Secondary | ICD-10-CM | POA: Diagnosis not present

## 2018-10-27 DIAGNOSIS — J301 Allergic rhinitis due to pollen: Secondary | ICD-10-CM | POA: Diagnosis not present

## 2018-10-27 DIAGNOSIS — J3089 Other allergic rhinitis: Secondary | ICD-10-CM | POA: Diagnosis not present

## 2018-10-28 LAB — HM PAP SMEAR

## 2018-11-03 DIAGNOSIS — J3089 Other allergic rhinitis: Secondary | ICD-10-CM | POA: Diagnosis not present

## 2018-11-03 DIAGNOSIS — J301 Allergic rhinitis due to pollen: Secondary | ICD-10-CM | POA: Diagnosis not present

## 2018-11-10 DIAGNOSIS — J301 Allergic rhinitis due to pollen: Secondary | ICD-10-CM | POA: Diagnosis not present

## 2018-11-10 DIAGNOSIS — J3089 Other allergic rhinitis: Secondary | ICD-10-CM | POA: Diagnosis not present

## 2018-11-17 DIAGNOSIS — J301 Allergic rhinitis due to pollen: Secondary | ICD-10-CM | POA: Diagnosis not present

## 2018-11-17 DIAGNOSIS — J3089 Other allergic rhinitis: Secondary | ICD-10-CM | POA: Diagnosis not present

## 2018-11-24 DIAGNOSIS — J301 Allergic rhinitis due to pollen: Secondary | ICD-10-CM | POA: Diagnosis not present

## 2018-11-24 DIAGNOSIS — J3089 Other allergic rhinitis: Secondary | ICD-10-CM | POA: Diagnosis not present

## 2018-11-25 ENCOUNTER — Encounter: Payer: Self-pay | Admitting: Internal Medicine

## 2018-12-01 DIAGNOSIS — J301 Allergic rhinitis due to pollen: Secondary | ICD-10-CM | POA: Diagnosis not present

## 2018-12-01 DIAGNOSIS — J3089 Other allergic rhinitis: Secondary | ICD-10-CM | POA: Diagnosis not present

## 2018-12-04 ENCOUNTER — Encounter: Payer: Self-pay | Admitting: Internal Medicine

## 2018-12-08 DIAGNOSIS — J301 Allergic rhinitis due to pollen: Secondary | ICD-10-CM | POA: Diagnosis not present

## 2018-12-08 DIAGNOSIS — J3089 Other allergic rhinitis: Secondary | ICD-10-CM | POA: Diagnosis not present

## 2018-12-12 DIAGNOSIS — J301 Allergic rhinitis due to pollen: Secondary | ICD-10-CM | POA: Diagnosis not present

## 2018-12-12 DIAGNOSIS — J3089 Other allergic rhinitis: Secondary | ICD-10-CM | POA: Diagnosis not present

## 2018-12-15 DIAGNOSIS — J3089 Other allergic rhinitis: Secondary | ICD-10-CM | POA: Diagnosis not present

## 2018-12-15 DIAGNOSIS — J301 Allergic rhinitis due to pollen: Secondary | ICD-10-CM | POA: Diagnosis not present

## 2018-12-22 DIAGNOSIS — J301 Allergic rhinitis due to pollen: Secondary | ICD-10-CM | POA: Diagnosis not present

## 2018-12-22 DIAGNOSIS — J3089 Other allergic rhinitis: Secondary | ICD-10-CM | POA: Diagnosis not present

## 2018-12-30 DIAGNOSIS — J3089 Other allergic rhinitis: Secondary | ICD-10-CM | POA: Diagnosis not present

## 2018-12-30 DIAGNOSIS — J301 Allergic rhinitis due to pollen: Secondary | ICD-10-CM | POA: Diagnosis not present

## 2019-01-05 DIAGNOSIS — J301 Allergic rhinitis due to pollen: Secondary | ICD-10-CM | POA: Diagnosis not present

## 2019-01-05 DIAGNOSIS — J3089 Other allergic rhinitis: Secondary | ICD-10-CM | POA: Diagnosis not present

## 2019-01-12 DIAGNOSIS — J301 Allergic rhinitis due to pollen: Secondary | ICD-10-CM | POA: Diagnosis not present

## 2019-01-12 DIAGNOSIS — J3089 Other allergic rhinitis: Secondary | ICD-10-CM | POA: Diagnosis not present

## 2019-01-14 DIAGNOSIS — J301 Allergic rhinitis due to pollen: Secondary | ICD-10-CM | POA: Diagnosis not present

## 2019-01-14 DIAGNOSIS — J3089 Other allergic rhinitis: Secondary | ICD-10-CM | POA: Diagnosis not present

## 2019-01-19 DIAGNOSIS — J3089 Other allergic rhinitis: Secondary | ICD-10-CM | POA: Diagnosis not present

## 2019-01-19 DIAGNOSIS — J301 Allergic rhinitis due to pollen: Secondary | ICD-10-CM | POA: Diagnosis not present

## 2019-01-21 DIAGNOSIS — J3089 Other allergic rhinitis: Secondary | ICD-10-CM | POA: Diagnosis not present

## 2019-01-21 DIAGNOSIS — J301 Allergic rhinitis due to pollen: Secondary | ICD-10-CM | POA: Diagnosis not present

## 2019-01-26 DIAGNOSIS — J3089 Other allergic rhinitis: Secondary | ICD-10-CM | POA: Diagnosis not present

## 2019-01-26 DIAGNOSIS — J301 Allergic rhinitis due to pollen: Secondary | ICD-10-CM | POA: Diagnosis not present

## 2019-02-02 DIAGNOSIS — J301 Allergic rhinitis due to pollen: Secondary | ICD-10-CM | POA: Diagnosis not present

## 2019-02-02 DIAGNOSIS — J3089 Other allergic rhinitis: Secondary | ICD-10-CM | POA: Diagnosis not present

## 2019-02-09 DIAGNOSIS — J301 Allergic rhinitis due to pollen: Secondary | ICD-10-CM | POA: Diagnosis not present

## 2019-02-09 DIAGNOSIS — J3089 Other allergic rhinitis: Secondary | ICD-10-CM | POA: Diagnosis not present

## 2019-02-10 ENCOUNTER — Encounter: Payer: Self-pay | Admitting: Internal Medicine

## 2019-02-10 DIAGNOSIS — Z1211 Encounter for screening for malignant neoplasm of colon: Secondary | ICD-10-CM | POA: Diagnosis not present

## 2019-02-16 DIAGNOSIS — J3089 Other allergic rhinitis: Secondary | ICD-10-CM | POA: Diagnosis not present

## 2019-02-16 DIAGNOSIS — J301 Allergic rhinitis due to pollen: Secondary | ICD-10-CM | POA: Diagnosis not present

## 2019-02-23 DIAGNOSIS — J3089 Other allergic rhinitis: Secondary | ICD-10-CM | POA: Diagnosis not present

## 2019-02-23 DIAGNOSIS — J301 Allergic rhinitis due to pollen: Secondary | ICD-10-CM | POA: Diagnosis not present

## 2019-03-03 DIAGNOSIS — J3089 Other allergic rhinitis: Secondary | ICD-10-CM | POA: Diagnosis not present

## 2019-03-03 DIAGNOSIS — J301 Allergic rhinitis due to pollen: Secondary | ICD-10-CM | POA: Diagnosis not present

## 2019-03-04 DIAGNOSIS — J3089 Other allergic rhinitis: Secondary | ICD-10-CM | POA: Diagnosis not present

## 2019-03-09 DIAGNOSIS — J301 Allergic rhinitis due to pollen: Secondary | ICD-10-CM | POA: Diagnosis not present

## 2019-03-09 DIAGNOSIS — J3089 Other allergic rhinitis: Secondary | ICD-10-CM | POA: Diagnosis not present

## 2019-03-16 DIAGNOSIS — J3089 Other allergic rhinitis: Secondary | ICD-10-CM | POA: Diagnosis not present

## 2019-03-16 DIAGNOSIS — J301 Allergic rhinitis due to pollen: Secondary | ICD-10-CM | POA: Diagnosis not present

## 2019-03-23 DIAGNOSIS — J301 Allergic rhinitis due to pollen: Secondary | ICD-10-CM | POA: Diagnosis not present

## 2019-03-23 DIAGNOSIS — J3089 Other allergic rhinitis: Secondary | ICD-10-CM | POA: Diagnosis not present

## 2019-03-25 DIAGNOSIS — J3089 Other allergic rhinitis: Secondary | ICD-10-CM | POA: Diagnosis not present

## 2019-03-25 DIAGNOSIS — J301 Allergic rhinitis due to pollen: Secondary | ICD-10-CM | POA: Diagnosis not present

## 2019-03-30 DIAGNOSIS — J301 Allergic rhinitis due to pollen: Secondary | ICD-10-CM | POA: Diagnosis not present

## 2019-03-30 DIAGNOSIS — J3089 Other allergic rhinitis: Secondary | ICD-10-CM | POA: Diagnosis not present

## 2019-04-01 DIAGNOSIS — J3089 Other allergic rhinitis: Secondary | ICD-10-CM | POA: Diagnosis not present

## 2019-04-01 DIAGNOSIS — J301 Allergic rhinitis due to pollen: Secondary | ICD-10-CM | POA: Diagnosis not present

## 2019-04-06 DIAGNOSIS — J3089 Other allergic rhinitis: Secondary | ICD-10-CM | POA: Diagnosis not present

## 2019-04-06 DIAGNOSIS — J301 Allergic rhinitis due to pollen: Secondary | ICD-10-CM | POA: Diagnosis not present

## 2019-04-13 DIAGNOSIS — J301 Allergic rhinitis due to pollen: Secondary | ICD-10-CM | POA: Diagnosis not present

## 2019-04-13 DIAGNOSIS — J3089 Other allergic rhinitis: Secondary | ICD-10-CM | POA: Diagnosis not present

## 2019-04-20 DIAGNOSIS — J301 Allergic rhinitis due to pollen: Secondary | ICD-10-CM | POA: Diagnosis not present

## 2019-04-20 DIAGNOSIS — J3089 Other allergic rhinitis: Secondary | ICD-10-CM | POA: Diagnosis not present

## 2019-04-27 DIAGNOSIS — J3089 Other allergic rhinitis: Secondary | ICD-10-CM | POA: Diagnosis not present

## 2019-04-27 DIAGNOSIS — J301 Allergic rhinitis due to pollen: Secondary | ICD-10-CM | POA: Diagnosis not present

## 2019-05-04 DIAGNOSIS — J3089 Other allergic rhinitis: Secondary | ICD-10-CM | POA: Diagnosis not present

## 2019-05-04 DIAGNOSIS — J301 Allergic rhinitis due to pollen: Secondary | ICD-10-CM | POA: Diagnosis not present

## 2019-05-11 DIAGNOSIS — J3089 Other allergic rhinitis: Secondary | ICD-10-CM | POA: Diagnosis not present

## 2019-05-11 DIAGNOSIS — J301 Allergic rhinitis due to pollen: Secondary | ICD-10-CM | POA: Diagnosis not present

## 2019-05-18 DIAGNOSIS — J301 Allergic rhinitis due to pollen: Secondary | ICD-10-CM | POA: Diagnosis not present

## 2019-05-18 DIAGNOSIS — J3089 Other allergic rhinitis: Secondary | ICD-10-CM | POA: Diagnosis not present

## 2019-05-25 DIAGNOSIS — J3089 Other allergic rhinitis: Secondary | ICD-10-CM | POA: Diagnosis not present

## 2019-05-25 DIAGNOSIS — J301 Allergic rhinitis due to pollen: Secondary | ICD-10-CM | POA: Diagnosis not present

## 2019-06-01 DIAGNOSIS — J301 Allergic rhinitis due to pollen: Secondary | ICD-10-CM | POA: Diagnosis not present

## 2019-06-01 DIAGNOSIS — J3089 Other allergic rhinitis: Secondary | ICD-10-CM | POA: Diagnosis not present

## 2019-06-08 DIAGNOSIS — J301 Allergic rhinitis due to pollen: Secondary | ICD-10-CM | POA: Diagnosis not present

## 2019-06-08 DIAGNOSIS — J3089 Other allergic rhinitis: Secondary | ICD-10-CM | POA: Diagnosis not present

## 2019-06-16 DIAGNOSIS — J3089 Other allergic rhinitis: Secondary | ICD-10-CM | POA: Diagnosis not present

## 2019-06-16 DIAGNOSIS — J301 Allergic rhinitis due to pollen: Secondary | ICD-10-CM | POA: Diagnosis not present

## 2019-06-22 DIAGNOSIS — J3089 Other allergic rhinitis: Secondary | ICD-10-CM | POA: Diagnosis not present

## 2019-06-22 DIAGNOSIS — J301 Allergic rhinitis due to pollen: Secondary | ICD-10-CM | POA: Diagnosis not present

## 2019-06-29 DIAGNOSIS — J301 Allergic rhinitis due to pollen: Secondary | ICD-10-CM | POA: Diagnosis not present

## 2019-06-29 DIAGNOSIS — J3089 Other allergic rhinitis: Secondary | ICD-10-CM | POA: Diagnosis not present

## 2019-07-06 DIAGNOSIS — J301 Allergic rhinitis due to pollen: Secondary | ICD-10-CM | POA: Diagnosis not present

## 2019-07-06 DIAGNOSIS — J3089 Other allergic rhinitis: Secondary | ICD-10-CM | POA: Diagnosis not present

## 2019-07-13 DIAGNOSIS — J301 Allergic rhinitis due to pollen: Secondary | ICD-10-CM | POA: Diagnosis not present

## 2019-07-13 DIAGNOSIS — J3089 Other allergic rhinitis: Secondary | ICD-10-CM | POA: Diagnosis not present

## 2019-07-20 DIAGNOSIS — J3089 Other allergic rhinitis: Secondary | ICD-10-CM | POA: Diagnosis not present

## 2019-07-20 DIAGNOSIS — J301 Allergic rhinitis due to pollen: Secondary | ICD-10-CM | POA: Diagnosis not present

## 2019-07-22 DIAGNOSIS — J301 Allergic rhinitis due to pollen: Secondary | ICD-10-CM | POA: Diagnosis not present

## 2019-07-23 DIAGNOSIS — J3089 Other allergic rhinitis: Secondary | ICD-10-CM | POA: Diagnosis not present

## 2019-07-27 DIAGNOSIS — J301 Allergic rhinitis due to pollen: Secondary | ICD-10-CM | POA: Diagnosis not present

## 2019-07-27 DIAGNOSIS — J3089 Other allergic rhinitis: Secondary | ICD-10-CM | POA: Diagnosis not present

## 2019-08-03 DIAGNOSIS — J301 Allergic rhinitis due to pollen: Secondary | ICD-10-CM | POA: Diagnosis not present

## 2019-08-03 DIAGNOSIS — J3089 Other allergic rhinitis: Secondary | ICD-10-CM | POA: Diagnosis not present

## 2019-08-10 DIAGNOSIS — J3089 Other allergic rhinitis: Secondary | ICD-10-CM | POA: Diagnosis not present

## 2019-08-10 DIAGNOSIS — J301 Allergic rhinitis due to pollen: Secondary | ICD-10-CM | POA: Diagnosis not present

## 2019-08-14 DIAGNOSIS — R0602 Shortness of breath: Secondary | ICD-10-CM | POA: Diagnosis not present

## 2019-08-14 DIAGNOSIS — H1045 Other chronic allergic conjunctivitis: Secondary | ICD-10-CM | POA: Diagnosis not present

## 2019-08-14 DIAGNOSIS — J301 Allergic rhinitis due to pollen: Secondary | ICD-10-CM | POA: Diagnosis not present

## 2019-08-14 DIAGNOSIS — J3089 Other allergic rhinitis: Secondary | ICD-10-CM | POA: Diagnosis not present

## 2019-08-24 DIAGNOSIS — J3089 Other allergic rhinitis: Secondary | ICD-10-CM | POA: Diagnosis not present

## 2019-08-24 DIAGNOSIS — J301 Allergic rhinitis due to pollen: Secondary | ICD-10-CM | POA: Diagnosis not present

## 2019-08-26 DIAGNOSIS — J3089 Other allergic rhinitis: Secondary | ICD-10-CM | POA: Diagnosis not present

## 2019-08-26 DIAGNOSIS — J301 Allergic rhinitis due to pollen: Secondary | ICD-10-CM | POA: Diagnosis not present

## 2019-08-31 DIAGNOSIS — J301 Allergic rhinitis due to pollen: Secondary | ICD-10-CM | POA: Diagnosis not present

## 2019-08-31 DIAGNOSIS — J3089 Other allergic rhinitis: Secondary | ICD-10-CM | POA: Diagnosis not present

## 2019-09-07 DIAGNOSIS — J3089 Other allergic rhinitis: Secondary | ICD-10-CM | POA: Diagnosis not present

## 2019-09-07 DIAGNOSIS — J301 Allergic rhinitis due to pollen: Secondary | ICD-10-CM | POA: Diagnosis not present

## 2019-09-09 DIAGNOSIS — J301 Allergic rhinitis due to pollen: Secondary | ICD-10-CM | POA: Diagnosis not present

## 2019-09-09 DIAGNOSIS — J3089 Other allergic rhinitis: Secondary | ICD-10-CM | POA: Diagnosis not present

## 2019-09-14 DIAGNOSIS — J3089 Other allergic rhinitis: Secondary | ICD-10-CM | POA: Diagnosis not present

## 2019-09-14 DIAGNOSIS — J301 Allergic rhinitis due to pollen: Secondary | ICD-10-CM | POA: Diagnosis not present

## 2019-09-21 DIAGNOSIS — J3089 Other allergic rhinitis: Secondary | ICD-10-CM | POA: Diagnosis not present

## 2019-09-21 DIAGNOSIS — J301 Allergic rhinitis due to pollen: Secondary | ICD-10-CM | POA: Diagnosis not present

## 2019-09-28 DIAGNOSIS — J301 Allergic rhinitis due to pollen: Secondary | ICD-10-CM | POA: Diagnosis not present

## 2019-09-28 DIAGNOSIS — J3089 Other allergic rhinitis: Secondary | ICD-10-CM | POA: Diagnosis not present

## 2019-10-05 DIAGNOSIS — J3089 Other allergic rhinitis: Secondary | ICD-10-CM | POA: Diagnosis not present

## 2019-10-05 DIAGNOSIS — J301 Allergic rhinitis due to pollen: Secondary | ICD-10-CM | POA: Diagnosis not present

## 2019-10-12 DIAGNOSIS — J3089 Other allergic rhinitis: Secondary | ICD-10-CM | POA: Diagnosis not present

## 2019-10-12 DIAGNOSIS — J301 Allergic rhinitis due to pollen: Secondary | ICD-10-CM | POA: Diagnosis not present

## 2019-10-23 DIAGNOSIS — J301 Allergic rhinitis due to pollen: Secondary | ICD-10-CM | POA: Diagnosis not present

## 2019-10-23 DIAGNOSIS — J3089 Other allergic rhinitis: Secondary | ICD-10-CM | POA: Diagnosis not present

## 2019-11-05 DIAGNOSIS — J301 Allergic rhinitis due to pollen: Secondary | ICD-10-CM | POA: Diagnosis not present

## 2019-11-05 DIAGNOSIS — J3089 Other allergic rhinitis: Secondary | ICD-10-CM | POA: Diagnosis not present

## 2019-11-09 ENCOUNTER — Telehealth: Payer: Self-pay | Admitting: Internal Medicine

## 2019-11-09 MED ORDER — ROSUVASTATIN CALCIUM 10 MG PO TABS: 10.0000 mg | ORAL_TABLET | Freq: Every day | ORAL | 5 refills | Status: DC

## 2019-11-09 NOTE — Telephone Encounter (Signed)
Medication refill: Rosuvastatin  Pharmacy: [facility] - [place], [place] - [address]

## 2019-11-09 NOTE — Telephone Encounter (Signed)
Medicaiton has been sent in

## 2019-11-18 DIAGNOSIS — J301 Allergic rhinitis due to pollen: Secondary | ICD-10-CM | POA: Diagnosis not present

## 2019-11-18 DIAGNOSIS — J3089 Other allergic rhinitis: Secondary | ICD-10-CM | POA: Diagnosis not present

## 2019-12-04 DIAGNOSIS — J3089 Other allergic rhinitis: Secondary | ICD-10-CM | POA: Diagnosis not present

## 2019-12-04 DIAGNOSIS — J301 Allergic rhinitis due to pollen: Secondary | ICD-10-CM | POA: Diagnosis not present

## 2019-12-18 DIAGNOSIS — J301 Allergic rhinitis due to pollen: Secondary | ICD-10-CM | POA: Diagnosis not present

## 2019-12-18 DIAGNOSIS — J3089 Other allergic rhinitis: Secondary | ICD-10-CM | POA: Diagnosis not present

## 2019-12-23 DIAGNOSIS — J3089 Other allergic rhinitis: Secondary | ICD-10-CM | POA: Diagnosis not present

## 2019-12-23 DIAGNOSIS — J301 Allergic rhinitis due to pollen: Secondary | ICD-10-CM | POA: Diagnosis not present

## 2019-12-23 LAB — CBC AND DIFFERENTIAL
HCT: 39 (ref 36–46)
Hemoglobin: 13.2 (ref 12.0–16.0)
Platelets: 306 (ref 150–399)
WBC: 5.5

## 2019-12-23 LAB — COMPREHENSIVE METABOLIC PANEL
Albumin: 4.9 (ref 3.5–5.0)
Calcium: 10 (ref 8.7–10.7)
Globulin: 1.9

## 2019-12-23 LAB — LIPID PANEL
Cholesterol: 207 — AB (ref 0–200)
HDL: 108 — AB (ref 35–70)
LDL Cholesterol: 89
Triglycerides: 52 (ref 40–160)

## 2019-12-23 LAB — BASIC METABOLIC PANEL
BUN: 12 (ref 4–21)
Chloride: 100 (ref 99–108)
Creatinine: 0.7 (ref 0.5–1.1)
Glucose: 87
Potassium: 4.6 (ref 3.4–5.3)
Sodium: 138 (ref 137–147)

## 2019-12-23 LAB — HEMOGLOBIN A1C: Hemoglobin A1C: 5.2

## 2019-12-23 LAB — HEPATIC FUNCTION PANEL
ALT: 23 (ref 7–35)
AST: 30 (ref 13–35)
Alkaline Phosphatase: 47 (ref 25–125)
Bilirubin, Total: 0.5

## 2019-12-23 LAB — CBC: RBC: 3.99 (ref 3.87–5.11)

## 2019-12-28 DIAGNOSIS — J301 Allergic rhinitis due to pollen: Secondary | ICD-10-CM | POA: Diagnosis not present

## 2019-12-28 DIAGNOSIS — J3089 Other allergic rhinitis: Secondary | ICD-10-CM | POA: Diagnosis not present

## 2020-01-05 DIAGNOSIS — J3089 Other allergic rhinitis: Secondary | ICD-10-CM | POA: Diagnosis not present

## 2020-01-05 DIAGNOSIS — J301 Allergic rhinitis due to pollen: Secondary | ICD-10-CM | POA: Diagnosis not present

## 2020-01-14 ENCOUNTER — Other Ambulatory Visit: Payer: Self-pay

## 2020-01-15 ENCOUNTER — Other Ambulatory Visit: Payer: Self-pay

## 2020-01-15 ENCOUNTER — Ambulatory Visit (INDEPENDENT_AMBULATORY_CARE_PROVIDER_SITE_OTHER): Payer: BC Managed Care – PPO | Admitting: Internal Medicine

## 2020-01-15 ENCOUNTER — Encounter: Payer: Self-pay | Admitting: Internal Medicine

## 2020-01-15 VITALS — BP 120/76 | HR 50 | Temp 98.1°F | Ht 61.5 in | Wt 106.6 lb

## 2020-01-15 DIAGNOSIS — Z79899 Other long term (current) drug therapy: Secondary | ICD-10-CM

## 2020-01-15 DIAGNOSIS — J301 Allergic rhinitis due to pollen: Secondary | ICD-10-CM | POA: Diagnosis not present

## 2020-01-15 DIAGNOSIS — E785 Hyperlipidemia, unspecified: Secondary | ICD-10-CM | POA: Diagnosis not present

## 2020-01-15 DIAGNOSIS — Z0000 Encounter for general adult medical examination without abnormal findings: Secondary | ICD-10-CM | POA: Diagnosis not present

## 2020-01-15 DIAGNOSIS — J3089 Other allergic rhinitis: Secondary | ICD-10-CM | POA: Diagnosis not present

## 2020-01-15 DIAGNOSIS — R931 Abnormal findings on diagnostic imaging of heart and coronary circulation: Secondary | ICD-10-CM

## 2020-01-15 DIAGNOSIS — Z9189 Other specified personal risk factors, not elsewhere classified: Secondary | ICD-10-CM | POA: Diagnosis not present

## 2020-01-15 DIAGNOSIS — Z8249 Family history of ischemic heart disease and other diseases of the circulatory system: Secondary | ICD-10-CM

## 2020-01-15 NOTE — Progress Notes (Signed)
Chief Complaint  Patient presents with  . Annual Exam    Doing well    HPI: Patient  Tracey Patrick  64 y.o. comes in today for Preventive Health Care visit     Allergy shots     No asthma . Ask about screening cxray with patient exposures but no hx of covid  hjd vaccine   Taking crestor  10 mg and feels may be causeing a brain fog  At times   Wonders about other options  Has dec supplements to FO vits and  Etc . Had cac score a few years ago 74%ile  And fam hx    No real cv sx and has exercise program    Has gyne  Due routine soon     Has had bumps lesion gums right lower   Dentist thought maybe from vaccine  But continuing no sx   Next step oral surgeon.     Thinks td utd  As traveled to [place]  A few years ago     Health Maintenance  Topic Date Due  . HIV Screening  Never done  . TETANUS/TDAP  09/24/2018  . INFLUENZA VACCINE  05/08/2020  . MAMMOGRAM  02/09/2021  . PAP SMEAR-Modifier  10/28/2021  . COLONOSCOPY  02/09/2029  . Hepatitis C Screening  Completed   Health Maintenance Review LIFESTYLE:  Exercise:  180 per week  Of  High impact  Tobacco/ETS: no Alcohol:  1 per day Sugar beverages: Sleep: 8.5 hours  Drug use: no HH of  2  4 dogs Work: [place] vaccine  May have had td in past  Went to [place]    ROS:  GEN/ HEENT: No fever, significant weight changes sweats headaches vision problems hearing changes, CV/ PULM; No chest pain shortness of breath cough, syncope,edema  change in exercise tolerance. GI /GU: No adominal pain, vomiting, change in bowel habits. No blood in the stool. No significant GU symptoms. SKIN/HEME: ,no acute skin rashes suspicious lesions or bleeding. No lymphadenopathy, nodules, masses.  NEURO/ PSYCH:  No neurologic signs such as weakness numbness. No depression anxiety. IMM/ Allergy: No unusual infections.  Allergy .   REST of 12 system review negative except as per HPI   Past Medical History:  Diagnosis Date  . CHEST  PAIN, ATYPICAL 09/24/2008   Qualifier: Diagnosis of  By: Lawernce Ion, CMA (AAMA), Bethann Berkshire COLONIC POLYPS, HX OF 10/04/2008   Qualifier: Diagnosis of  By: Fabian Sharp MD, Neta Mends   . Desensitization to allergy shot    van winkle  2016  . Endometriosis   . ENDOMETRIOSIS 09/24/2008   Qualifier: Diagnosis of  By: Lawernce Ion, CMA (AAMA), Bethann Berkshire   . HEMORRHOIDS 09/24/2008   Qualifier: Diagnosis of  By: Lawernce Ion, CMA (AAMA), Bethann Berkshire   . SCOLIOSIS 09/24/2008   Qualifier: Diagnosis of  By: Lawernce Ion, CMA (AAMA), Bethann Berkshire     Past Surgical History:  Procedure Laterality Date  . ENDOMETRIAL ABLATION  2004  . LIPOSUCTION  2019  . OSTEOTOMY  1984   for overbite   maxillary     Family History  Problem Relation Age of Onset  . Breast cancer Mother        died mva age 89  . Macular degeneration Father   . CVA Father        felt secondary to Vioxx  . Hyperlipidemia Father   . Hyperlipidemia Brother        3 brothes   . Alcohol  abuse Paternal Grandfather     Social History   Socioeconomic History  . Marital status: Married    Spouse name: Not on file  . Number of children: Not on file  . Years of education: Not on file  . Highest education level: Not on file  Occupational History  . Not on file  Tobacco Use  . Smoking status: Never Smoker  . Smokeless tobacco: Never Used  Substance and Sexual Activity  . Alcohol use: Yes    Alcohol/week: 6.0 standard drinks    Types: 6 Glasses of wine per week    Comment: wine nightly  . Drug use: No  . Sexual activity: Not on file  Other Topics Concern  . Not on file  Social History Narrative   hh of 2  Married    MD opthalmologist  Never tobacco    [id number]   Social Determinants of Health   Financial Resource Strain:   . Difficulty of Paying Living Expenses:   Food Insecurity:   . Worried About Programme researcher, broadcasting/film/video in the Last Year:   . Barista in the Last Year:   Transportation Needs:   . Freight forwarder (Medical):     Marland Kitchen Lack of Transportation (Non-Medical):   Physical Activity:   . Days of Exercise per Week:   . Minutes of Exercise per Session:   Stress:   . Feeling of Stress :   Social Connections:   . Frequency of Communication with Friends and Family:   . Frequency of Social Gatherings with Friends and Family:   . Attends Religious Services:   . Active Member of Clubs or Organizations:   . Attends Banker Meetings:   Marland Kitchen Marital Status:     Outpatient Medications Prior to Visit  Medication Sig Dispense Refill  . CALCIUM CARBONATE-VIT D-MIN PO Take by mouth. 500 MG OF CALCIUM    . CALCIUM PO Take 200 mg by mouth 2 (two) times daily.     Marland Kitchen co-enzyme Q-10 50 MG capsule Take 50 mg by mouth daily.    . cycloSPORINE (RESTASIS) 0.05 % ophthalmic emulsion Place 1 drop into both eyes 2 (two) times daily.    Marland Kitchen docusate sodium (COLACE) 100 MG capsule Take 100 mg by mouth daily.     . Ferrous Fumarate (IRON) 18 MG TBCR Take by mouth.    . Garlic 300 MG CAPS Take 1 capsule by mouth daily.    . Lutein 20 MG CAPS Take 20 mg by mouth daily.    . Multiple Vitamin (MULTIVITAMIN) tablet Take 1 tablet by mouth daily.    . Omega-3 Fatty Acids (FISH OIL PO) Take 1 g by mouth daily.    Marland Kitchen OVER THE COUNTER MEDICATION NATTO KINASE - 50 MG DAILY    . OVER THE COUNTER MEDICATION ZEAXANTHIN - 2 MG DAILY    . PRESCRIPTION MEDICATION ALLERGY SHOTS    . Psyllium (METAMUCIL FIBER PO) Take 20 g by mouth daily.    . rosuvastatin (CRESTOR) 10 MG tablet Take 1 tablet (10 mg total) by mouth daily. (Patient taking differently: Take 10 mg by mouth daily. Takes 2 times weekly) 30 tablet 5  . Witch Hazel (PREPARATION H EX) Apply topically daily.    Boris Lown Oil 500 MG CAPS Take 1 capsule by mouth daily.    . Red Yeast Rice Extract (RED YEAST RICE PO) Take 600 mg by mouth daily.     No facility-administered medications prior  to visit.     EXAM:  BP 120/76   Pulse (!) 50   Temp 98.1 F (36.7 C) (Temporal)   Ht 5'  1.5" (1.562 m)   Wt 106 lb 9.6 oz (48.4 kg)   SpO2 97%   BMI 19.82 kg/m   Body mass index is 19.82 kg/m. Wt Readings from Last 3 Encounters:  01/15/20 106 lb 9.6 oz (48.4 kg)  08/29/18 109 lb 9.6 oz (49.7 kg)  04/12/17 110 lb (49.9 kg)    Physical Exam: Vital signs reviewed GYI:RSWN is a well-developed well-nourished alert cooperative    who appearsr stated age in no acute distress.  HEENT: normocephalic atraumatic , Eyes: PERRL EOM's full, conjunctiva clear, Nares: paten,t no deformity discharge or tenderness., Ears: no deformity EAC's clear TMs with normal landmarks. Mouth: clear OP, cyt like area right lowre molars x 2 mucosa clear.  Moist mucous membranes. Dentition in adequate repair. NECK: supple without masses, thyromegaly or bruits. CHEST/PULM:  Clear to auscultation and percussion breath sounds equal no wheeze , rales or rhonchi. No chest wall deformities or tenderness. Breast:dferred CV: PMI is nondisplaced, S1 S2 no gallops, murmurs, rubs. Peripheral pulses are full without delay.No JVD .  ABDOMEN: Bowel sounds normal nontender  No guard or rebound, no hepato splenomegal no CVA tenderness.   Extremtities:  No clubbing cyanosis or edema, no acute joint swelling or redness no focal atrophy NEURO:  Oriented x3, cranial nerves 3-12 appear to be intact, no obvious focal weakness,gait within normal limits no abnormal reflexes or asymmetrical SKIN: No acute rashes normal turgor, color, no bruising or petechiae. PSYCH: Oriented, good eye contact, no obvious depression anxiety, cognition and judgment appear normal. LN: no cervical axillary inguinal adenopathy  Lab Results  Component Value Date   WBC 5.5 12/23/2019   HGB 13.2 12/23/2019   HCT 39 12/23/2019   PLT 306 12/23/2019   GLUCOSE 93 09/24/2008   CHOL 207 (A) 12/23/2019   TRIG 52 12/23/2019   HDL 108 (A) 12/23/2019   LDLDIRECT 109.2 09/24/2008   LDLCALC 89 12/23/2019   ALT 23 12/23/2019   AST 30 12/23/2019   NA 138  12/23/2019   K 4.6 12/23/2019   CL 100 12/23/2019   CREATININE 0.7 12/23/2019   BUN 12 12/23/2019   CO2 28 09/24/2008   TSH 2.41 01/02/2018   HGBA1C 5.2 12/23/2019    BP Readings from Last 3 Encounters:  01/15/20 120/76  08/29/18 102/64  04/12/17 104/62    Lab results reviewed with patient   ASSESSMENT AND PLAN:  Discussed the following assessment and plan:    ICD-10-CM   1. Visit for preventive health examination  Z00.00   2. Medication management  Z79.899    atypical  if se of med  so try 5 3 x per week for now and calendar  off and on   3. Cardiovascular risk factor  Z91.89 Ambulatory referral to Cardiology  4. Hyperlipidemia, unspecified hyperlipidemia type  E78.5 Ambulatory referral to Cardiology  5. Elevated coronary artery calcium score  R93.1 Ambulatory referral to Cardiology   74 %ile in past   6. Family history of heart disease  Z82.49 Ambulatory referral to Cardiology  unclear if se of med   Calendar and try 5  3 x per week Disc  cv risk assessment   Seems to have healthy eating and exercise   Had favorable particle size years ago  Consider apo b and other after  optim of statin or other  Disc  referral for cv risk assessment    Will refer to dr Antoine Poche who has seen her husband .  Disc low yield  With cxray but if any sx we can get this done.  Return in about 1 year (around 01/14/2021) for preventive /cpx and medications or as needed .  Patient Care Team: Jenney Brester, Neta Mends, MD as PCP - General (Internal Medicine) Sharrell Ku, MD as Consulting Physician (Gastroenterology) Marcelle Overlie, MD as Consulting Physician (Obstetrics and Gynecology) Patient Instructions  Try crestor  5mg  3 x per week.   Calendar  Sx  Off and on .   Will do referral for cv assessment .   Check on td  Booster .    Health Maintenance, Female Adopting a healthy lifestyle and getting preventive care are important in promoting health and wellness. Ask your health care provider  about:  The right schedule for you to have regular tests and exams.  Things you can do on your own to prevent diseases and keep yourself healthy. What should I know about diet, weight, and exercise? Eat a healthy diet   Eat a diet that includes plenty of vegetables, fruits, low-fat dairy products, and lean protein.  Do not eat a lot of foods that are high in solid fats, added sugars, or sodium. Maintain a healthy weight Body mass index (BMI) is used to identify weight problems. It estimates body fat based on height and weight. Your health care provider can help determine your BMI and help you achieve or maintain a healthy weight. Get regular exercise Get regular exercise. This is one of the most important things you can do for your health. Most adults should:  Exercise for at least 150 minutes each week. The exercise should increase your heart rate and make you sweat (moderate-intensity exercise).  Do strengthening exercises at least twice a week. This is in addition to the moderate-intensity exercise.  Spend less time sitting. Even light physical activity can be beneficial. Watch cholesterol and blood lipids Have your blood tested for lipids and cholesterol at 62 years of age, then have this test every 5 years. Have your cholesterol levels checked more often if:  Your lipid or cholesterol levels are high.  You are older than 62 years of age.  You are at high risk for heart disease. What should I know about cancer screening? Depending on your health history and family history, you may need to have cancer screening at various ages. This may include screening for:  Breast cancer.  Cervical cancer.  Colorectal cancer.  Skin cancer.  Lung cancer. What should I know about heart disease, diabetes, and high blood pressure? Blood pressure and heart disease  High blood pressure causes heart disease and increases the risk of stroke. This is more likely to develop in people who  have high blood pressure readings, are of African descent, or are overweight.  Have your blood pressure checked: ? Every 3-5 years if you are 61-65 years of age. ? Every year if you are 65 years old or older. Diabetes Have regular diabetes screenings. This checks your fasting blood sugar level. Have the screening done:  Once every three years after age 29 if you are at a normal weight and have a low risk for diabetes.  More often and at a younger age if you are overweight or have a high risk for diabetes. What should I know about preventing infection? Hepatitis B If you have a higher risk for hepatitis B, you  should be screened for this virus. Talk with your health care provider to find out if you are at risk for hepatitis B infection. Hepatitis C Testing is recommended for:  Everyone born from [id number] through 1965.  Anyone with known risk factors for hepatitis C. Sexually transmitted infections (STIs)  Get screened for STIs, including gonorrhea and chlamydia, if: ? You are sexually active and are younger than 62 years of age. ? You are older than 62 years of age and your health care provider tells you that you are at risk for this type of infection. ? Your sexual activity has changed since you were last screened, and you are at increased risk for chlamydia or gonorrhea. Ask your health care provider if you are at risk.  Ask your health care provider about whether you are at high risk for HIV. Your health care provider may recommend a prescription medicine to help prevent HIV infection. If you choose to take medicine to prevent HIV, you should first get tested for HIV. You should then be tested every 3 months for as long as you are taking the medicine. Pregnancy  If you are about to stop having your period (premenopausal) and you may become pregnant, seek counseling before you get pregnant.  Take 400 to 800 micrograms (mcg) of folic acid every day if you become pregnant.  Ask for birth  control (contraception) if you want to prevent pregnancy. Osteoporosis and menopause Osteoporosis is a disease in which the bones lose minerals and strength with aging. This can result in bone fractures. If you are 62 years old or older, or if you are at risk for osteoporosis and fractures, ask your health care provider if you should:  Be screened for bone loss.  Take a calcium or vitamin D supplement to lower your risk of fractures.  Be given hormone replacement therapy (HRT) to treat symptoms of menopause. Follow these instructions at home: Lifestyle  Do not use any products that contain nicotine or tobacco, such as cigarettes, e-cigarettes, and chewing tobacco. If you need help quitting, ask your health care provider.  Do not use street drugs.  Do not share needles.  Ask your health care provider for help if you need support or information about quitting drugs. Alcohol use  Do not drink alcohol if: ? Your health care provider tells you not to drink. ? You are pregnant, may be pregnant, or are planning to become pregnant.  If you drink alcohol: ? Limit how much you use to 0-1 drink a day. ? Limit intake if you are breastfeeding.  Be aware of how much alcohol is in your drink. In the [place] one drink equals one 12 oz bottle of beer (355 mL), one 5 oz glass of wine (148 mL), or one 1 oz glass of hard liquor (44 mL). General instructions  Schedule regular health, dental, and eye exams.  Stay current with your vaccines.  Tell your health care provider if: ? You often feel depressed. ? You have ever been abused or do not feel safe at home. Summary  Adopting a healthy lifestyle and getting preventive care are important in promoting health and wellness.  Follow your health care provider's instructions about healthy diet, exercising, and getting tested or screened for diseases.  Follow your health care provider's instructions on monitoring your cholesterol and blood  pressure. This information is not intended to replace advice given to you by your health care provider. Make sure you discuss any questions you have with  your health care provider. Document Revised: 09/17/2018 Document Reviewed: 09/17/2018 Elsevier Patient Education  2020 [place] Carlei Huang M.D.

## 2020-01-15 NOTE — Patient Instructions (Addendum)
Try crestor  5mg  3 x per week.   Calendar  Sx  Off and on .   Will do referral for cv assessment .   Check on td  Booster .    Health Maintenance, Female Adopting a healthy lifestyle and getting preventive care are important in promoting health and wellness. Ask your health care provider about:  The right schedule for you to have regular tests and exams.  Things you can do on your own to prevent diseases and keep yourself healthy. What should I know about diet, weight, and exercise? Eat a healthy diet   Eat a diet that includes plenty of vegetables, fruits, low-fat dairy products, and lean protein.  Do not eat a lot of foods that are high in solid fats, added sugars, or sodium. Maintain a healthy weight Body mass index (BMI) is used to identify weight problems. It estimates body fat based on height and weight. Your health care provider can help determine your BMI and help you achieve or maintain a healthy weight. Get regular exercise Get regular exercise. This is one of the most important things you can do for your health. Most adults should:  Exercise for at least 150 minutes each week. The exercise should increase your heart rate and make you sweat (moderate-intensity exercise).  Do strengthening exercises at least twice a week. This is in addition to the moderate-intensity exercise.  Spend less time sitting. Even light physical activity can be beneficial. Watch cholesterol and blood lipids Have your blood tested for lipids and cholesterol at 62 years of age, then have this test every 5 years. Have your cholesterol levels checked more often if:  Your lipid or cholesterol levels are high.  You are older than 62 years of age.  You are at high risk for heart disease. What should I know about cancer screening? Depending on your health history and family history, you may need to have cancer screening at various ages. This may include screening for:  Breast cancer.  Cervical  cancer.  Colorectal cancer.  Skin cancer.  Lung cancer. What should I know about heart disease, diabetes, and high blood pressure? Blood pressure and heart disease  High blood pressure causes heart disease and increases the risk of stroke. This is more likely to develop in people who have high blood pressure readings, are of African descent, or are overweight.  Have your blood pressure checked: ? Every 3-5 years if you are 75-70 years of age. ? Every year if you are 52 years old or older. Diabetes Have regular diabetes screenings. This checks your fasting blood sugar level. Have the screening done:  Once every three years after age 17 if you are at a normal weight and have a low risk for diabetes.  More often and at a younger age if you are overweight or have a high risk for diabetes. What should I know about preventing infection? Hepatitis B If you have a higher risk for hepatitis B, you should be screened for this virus. Talk with your health care provider to find out if you are at risk for hepatitis B infection. Hepatitis C Testing is recommended for:  Everyone born from [birth year] through 1965.  Anyone with known risk factors for hepatitis C. Sexually transmitted infections (STIs)  Get screened for STIs, including gonorrhea and chlamydia, if: ? You are sexually active and are younger than 62 years of age. ? You are older than 62 years of age and your health care provider tells  you that you are at risk for this type of infection. ? Your sexual activity has changed since you were last screened, and you are at increased risk for chlamydia or gonorrhea. Ask your health care provider if you are at risk.  Ask your health care provider about whether you are at high risk for HIV. Your health care provider may recommend a prescription medicine to help prevent HIV infection. If you choose to take medicine to prevent HIV, you should first get tested for HIV. You should then be tested every 3  months for as long as you are taking the medicine. Pregnancy  If you are about to stop having your period (premenopausal) and you may become pregnant, seek counseling before you get pregnant.  Take 400 to 800 micrograms (mcg) of folic acid every day if you become pregnant.  Ask for birth control (contraception) if you want to prevent pregnancy. Osteoporosis and menopause Osteoporosis is a disease in which the bones lose minerals and strength with aging. This can result in bone fractures. If you are 98 years old or older, or if you are at risk for osteoporosis and fractures, ask your health care provider if you should:  Be screened for bone loss.  Take a calcium or vitamin D supplement to lower your risk of fractures.  Be given hormone replacement therapy (HRT) to treat symptoms of menopause. Follow these instructions at home: Lifestyle  Do not use any products that contain nicotine or tobacco, such as cigarettes, e-cigarettes, and chewing tobacco. If you need help quitting, ask your health care provider.  Do not use street drugs.  Do not share needles.  Ask your health care provider for help if you need support or information about quitting drugs. Alcohol use  Do not drink alcohol if: ? Your health care provider tells you not to drink. ? You are pregnant, may be pregnant, or are planning to become pregnant.  If you drink alcohol: ? Limit how much you use to 0-1 drink a day. ? Limit intake if you are breastfeeding.  Be aware of how much alcohol is in your drink. In the [place] one drink equals one 12 oz bottle of beer (355 mL), one 5 oz glass of wine (148 mL), or one 1 oz glass of hard liquor (44 mL). General instructions  Schedule regular health, dental, and eye exams.  Stay current with your vaccines.  Tell your health care provider if: ? You often feel depressed. ? You have ever been abused or do not feel safe at home. Summary  Adopting a healthy lifestyle and getting  preventive care are important in promoting health and wellness.  Follow your health care provider's instructions about healthy diet, exercising, and getting tested or screened for diseases.  Follow your health care provider's instructions on monitoring your cholesterol and blood pressure. This information is not intended to replace advice given to you by your health care provider. Make sure you discuss any questions you have with your health care provider. Document Revised: 09/17/2018 Document Reviewed: 09/17/2018 Elsevier Patient Education  2020 [employer].

## 2020-01-20 DIAGNOSIS — J3089 Other allergic rhinitis: Secondary | ICD-10-CM | POA: Diagnosis not present

## 2020-01-20 DIAGNOSIS — J301 Allergic rhinitis due to pollen: Secondary | ICD-10-CM | POA: Diagnosis not present

## 2020-01-26 DIAGNOSIS — J3089 Other allergic rhinitis: Secondary | ICD-10-CM | POA: Diagnosis not present

## 2020-01-26 DIAGNOSIS — J301 Allergic rhinitis due to pollen: Secondary | ICD-10-CM | POA: Diagnosis not present

## 2020-01-28 DIAGNOSIS — J301 Allergic rhinitis due to pollen: Secondary | ICD-10-CM | POA: Diagnosis not present

## 2020-01-28 DIAGNOSIS — J3089 Other allergic rhinitis: Secondary | ICD-10-CM | POA: Diagnosis not present

## 2020-02-01 DIAGNOSIS — J301 Allergic rhinitis due to pollen: Secondary | ICD-10-CM | POA: Diagnosis not present

## 2020-02-01 DIAGNOSIS — J3089 Other allergic rhinitis: Secondary | ICD-10-CM | POA: Diagnosis not present

## 2020-02-08 DIAGNOSIS — J301 Allergic rhinitis due to pollen: Secondary | ICD-10-CM | POA: Diagnosis not present

## 2020-02-08 DIAGNOSIS — J3089 Other allergic rhinitis: Secondary | ICD-10-CM | POA: Diagnosis not present

## 2020-02-11 DIAGNOSIS — Z7189 Other specified counseling: Secondary | ICD-10-CM | POA: Insufficient documentation

## 2020-02-11 DIAGNOSIS — R931 Abnormal findings on diagnostic imaging of heart and coronary circulation: Secondary | ICD-10-CM | POA: Insufficient documentation

## 2020-02-11 NOTE — Progress Notes (Signed)
Cardiology Office Note   Date:  02/12/2020   ID:  Tracey Patrick, DOB 11-10-1955, MRN [id number]  PCP:  Tracey Headings, MD  Cardiologist:   No primary care provider on file. Referring:  Tracey Headings, MD  Chief Complaint  Patient presents with  . Eleated Coronary Calcium Score      History of Present Illness: Tracey Patrick is a 64 y.o. female who is referred by Tracey Headings, MD for evaluation of cardiovascular risk factors and an elevated coronary calcium score of 4 which was 74 percentile for her age and gender.   The patient has not had prior cardiac history.  She does have a strong family history of dyslipidemia.  There is some early heart disease with her mother having a heart attack in her mid to late 58s.  The patient is an ophthalmologist.  She gets some palpitations and fluttering sometimes at work.  Is not clear if this is musculoskeletal or cardiac.  She has never captured this.  She never had any presyncope or syncope.  She might have a little discomfort with this.  She denies any substernal chest pressure, neck or arm discomfort.  She has no significant shortness of breath, PND or orthopnea.  She does aerobics and some weightbearing exercises without bringing on symptoms.  I did see that she had an echocardiogram years ago.  There was some trace aortic insufficiency.  She otherwise had no cardiac issues.  Past Medical History:  Diagnosis Date  . CHEST PAIN, ATYPICAL 09/24/2008   Qualifier: Diagnosis of  By: Lawernce Ion, CMA (AAMA), Bethann Berkshire COLONIC POLYPS, HX OF 10/04/2008   Qualifier: Diagnosis of  By: Fabian Sharp MD, Neta Mends   . Desensitization to allergy shot    van winkle  2016  . Endometriosis   . ENDOMETRIOSIS 09/24/2008   Qualifier: Diagnosis of  By: Lawernce Ion, CMA (AAMA), Bethann Berkshire   . HEMORRHOIDS 09/24/2008   Qualifier: Diagnosis of  By: Lawernce Ion, CMA (AAMA), Bethann Berkshire   . SCOLIOSIS 09/24/2008   Qualifier: Diagnosis of  By: Lawernce Ion, CMA (AAMA), Bethann Berkshire     Past Surgical History:  Procedure Laterality Date  . ENDOMETRIAL ABLATION  2004  . LIPOSUCTION  2019  . OSTEOTOMY  1984   for overbite   maxillary      Current Outpatient Medications  Medication Sig Dispense Refill  . CALCIUM CARBONATE-VIT D-MIN PO Take by mouth. 500 MG OF CALCIUM    . co-enzyme Q-10 50 MG capsule Take 50 mg by mouth daily.    . cycloSPORINE (RESTASIS) 0.05 % ophthalmic emulsion Place 1 drop into both eyes 2 (two) times daily.    Marland Kitchen docusate sodium (COLACE) 100 MG capsule Take 100 mg by mouth daily.     . Ferrous Fumarate (IRON) 18 MG TBCR Take by mouth.    . Garlic 300 MG CAPS Take 1 capsule by mouth daily.    . Lutein 20 MG CAPS Take 20 mg by mouth daily.    . Multiple Vitamin (MULTIVITAMIN) tablet Take 1 tablet by mouth daily.    . Omega-3 Fatty Acids (FISH OIL PO) Take 1 g by mouth daily.    Marland Kitchen OVER THE COUNTER MEDICATION ZEAXANTHIN - 2 MG DAILY    . PRESCRIPTION MEDICATION ALLERGY SHOTS    . Psyllium (METAMUCIL FIBER PO) Take 20 g by mouth daily.    . rosuvastatin (CRESTOR) 5 MG tablet Take 5 mg by mouth daily. Take 5 mg  3 time a week Wed. Fri. Sat.     No current facility-administered medications for this visit.    Allergies:   Patient has no known allergies.    Social History:  The patient  reports that she has never smoked. She has never used smokeless tobacco. She reports current alcohol use of about 6.0 standard drinks of alcohol per week. She reports that she does not use drugs.   Family History:  The patient's family history includes Alcohol abuse in her paternal grandfather; Breast cancer in her mother; CVA in her father; Heart attack (age of onset: 58) in her mother; Hyperlipidemia in her brother, father, and mother; Macular degeneration in her father.    ROS:  Please see the history of present illness.   Otherwise, review of systems are positive for none.   All other systems are reviewed and negative.    PHYSICAL EXAM: VS:  BP 116/70    Pulse (!) 51   Ht 5\' 2"  (1.575 m)   Wt 107 lb (48.5 kg)   SpO2 100%   BMI 19.57 kg/m  , BMI Body mass index is 19.57 kg/m. GENERAL:  Well appearing HEENT:  Pupils equal round and reactive, fundi not visualized, oral mucosa unremarkable NECK:  No jugular venous distention, waveform within normal limits, carotid upstroke brisk and symmetric, no bruits, no thyromegaly LYMPHATICS:  No cervical, inguinal adenopathy LUNGS:  Clear to auscultation bilaterally BACK:  No CVA tenderness CHEST:  Unremarkable HEART:  PMI not displaced or sustained,S1 and S2 within normal limits, no S3, no S4, no clicks, no rubs, 2 out of 6 diastolic murmur heard best at the apex and proceeding well into diastole, no systolic murmurs ABD:  Flat, positive bowel sounds normal in frequency in pitch, no bruits, no rebound, no guarding, no midline pulsatile mass, no hepatomegaly, no splenomegaly EXT:  2 plus pulses throughout, no edema, no cyanosis no clubbing SKIN:  No rashes no nodules NEURO:  Cranial nerves II through XII grossly intact, motor grossly intact throughout PSYCH:  Cognitively intact, oriented to person place and time    EKG:  EKG is ordered today. The ekg ordered today demonstrates sinus rhythm, rate 51, axis within normal limits, intervals within normal limits, no acute ST-T wave changes.   Recent Labs: 12/23/2019: ALT 23; BUN 12; Creatinine 0.7; Hemoglobin 13.2; Platelets 306; Potassium 4.6; Sodium 138    Lipid Panel    Component Value Date/Time   CHOL 207 (A) 12/23/2019 0000   TRIG 52 12/23/2019 0000   HDL 108 (A) 12/23/2019 0000   CHOLHDL 2.3 CALC 09/24/2008 0857   VLDL 6 09/24/2008 0857   LDLCALC 89 12/23/2019 0000   LDLDIRECT 109.2 09/24/2008 0857      Wt Readings from Last 3 Encounters:  02/12/20 107 lb (48.5 kg)  01/15/20 106 lb 9.6 oz (48.4 kg)  08/29/18 109 lb 9.6 oz (49.7 kg)      Other studies Reviewed: Additional studies/ records that were reviewed today include: Labs,  CT. Review of the above records demonstrates:  Please see elsewhere in the note.     ASSESSMENT AND PLAN:   ELEVATED CORONARY CALCIUM SCORE:   Given the risk factors I think it is prudent to do a POET (Plain Old Exercise Treadmill).  Further management will be based on this.  DYSLIPIDEMIA:  Her MESA score is actually 1.85 which would necessitate aspirin or statin.  She does prefer to try low-dose of statin because her LDL was slightly elevated 89 even  on the dose that she is taking.  HDL however was 108.  I did discuss with her the fact that there is no class I or II indication for statin in her situation.  MURMUR: She has a very soft murmur it turns out she did have some trace aortic insufficiency years ago on an echo.  I will follow up with an echocardiogram.   Current medicines are reviewed at length with the patient today.  The patient does not have concerns regarding medicines.  The following changes have been made:  no change  Labs/ tests ordered today include:   Orders Placed This Encounter  Procedures  . EXERCISE TOLERANCE TEST (ETT)  . EKG 12-Lead  . ECHOCARDIOGRAM COMPLETE     Disposition:   FU with me as needed.      Signed, Minus Breeding, MD  02/12/2020 12:58 PM    [facility]

## 2020-02-12 ENCOUNTER — Encounter: Payer: Self-pay | Admitting: Cardiology

## 2020-02-12 ENCOUNTER — Ambulatory Visit: Payer: BC Managed Care – PPO | Admitting: Cardiology

## 2020-02-12 ENCOUNTER — Other Ambulatory Visit: Payer: Self-pay

## 2020-02-12 VITALS — BP 116/70 | HR 51 | Ht 62.0 in | Wt 107.0 lb

## 2020-02-12 DIAGNOSIS — R011 Cardiac murmur, unspecified: Secondary | ICD-10-CM

## 2020-02-12 DIAGNOSIS — R931 Abnormal findings on diagnostic imaging of heart and coronary circulation: Secondary | ICD-10-CM

## 2020-02-12 DIAGNOSIS — R072 Precordial pain: Secondary | ICD-10-CM

## 2020-02-12 DIAGNOSIS — Z7189 Other specified counseling: Secondary | ICD-10-CM

## 2020-02-12 NOTE — Patient Instructions (Signed)
Medication Instructions:  NO CHANGES *If you need a refill on your cardiac medications before your next appointment, please call your pharmacy*  Lab Work: You will need a Covid Screening 3 days prior to your exercise tolerance test. You will need to self quarantine after the screening until your procedure.This is a Drive Up Visit at the [facility] [address] [place]. Someone will direct you to the appropriate testing line. Stay in your car and someone will be with you shortly  Testing/Procedures: Your physician has requested that you have an echocardiogram. Echocardiography is a painless test that uses sound waves to create images of your heart. It provides your doctor with information about the size and shape of your heart and how well your heart's chambers and valves are working. This procedure takes approximately one hour. There are no restrictions for this procedure. [address]  Your physician has requested that you have an exercise tolerance test. For further information please visit [email or web address] Please also follow instruction sheet, as given.  Follow-Up: At [facility], you and your health needs are our priority.  As part of our continuing mission to provide you with exceptional heart care, we have created designated Provider Care Teams.  These Care Teams include your primary Cardiologist (physician) and Advanced Practice Providers (APPs -  Physician Assistants and Nurse Practitioners) who all work together to provide you with the care you need, when you need it.  Your next appointment:   FOLLOW UP AS NEEDED

## 2020-02-15 DIAGNOSIS — J301 Allergic rhinitis due to pollen: Secondary | ICD-10-CM | POA: Diagnosis not present

## 2020-02-15 DIAGNOSIS — J3089 Other allergic rhinitis: Secondary | ICD-10-CM | POA: Diagnosis not present

## 2020-02-17 DIAGNOSIS — J301 Allergic rhinitis due to pollen: Secondary | ICD-10-CM | POA: Diagnosis not present

## 2020-02-17 DIAGNOSIS — J3089 Other allergic rhinitis: Secondary | ICD-10-CM | POA: Diagnosis not present

## 2020-02-22 DIAGNOSIS — J3089 Other allergic rhinitis: Secondary | ICD-10-CM | POA: Diagnosis not present

## 2020-02-22 DIAGNOSIS — J301 Allergic rhinitis due to pollen: Secondary | ICD-10-CM | POA: Diagnosis not present

## 2020-02-24 DIAGNOSIS — J3089 Other allergic rhinitis: Secondary | ICD-10-CM | POA: Diagnosis not present

## 2020-02-24 DIAGNOSIS — J301 Allergic rhinitis due to pollen: Secondary | ICD-10-CM | POA: Diagnosis not present

## 2020-02-29 DIAGNOSIS — J301 Allergic rhinitis due to pollen: Secondary | ICD-10-CM | POA: Diagnosis not present

## 2020-02-29 DIAGNOSIS — J3089 Other allergic rhinitis: Secondary | ICD-10-CM | POA: Diagnosis not present

## 2020-03-01 ENCOUNTER — Ambulatory Visit (HOSPITAL_COMMUNITY): Payer: BC Managed Care – PPO | Attending: Cardiology

## 2020-03-01 ENCOUNTER — Other Ambulatory Visit: Payer: Self-pay

## 2020-03-01 DIAGNOSIS — R011 Cardiac murmur, unspecified: Secondary | ICD-10-CM | POA: Diagnosis not present

## 2020-03-08 DIAGNOSIS — J301 Allergic rhinitis due to pollen: Secondary | ICD-10-CM | POA: Diagnosis not present

## 2020-03-08 DIAGNOSIS — J3089 Other allergic rhinitis: Secondary | ICD-10-CM | POA: Diagnosis not present

## 2020-03-10 ENCOUNTER — Telehealth: Payer: Self-pay

## 2020-03-10 DIAGNOSIS — I7789 Other specified disorders of arteries and arterioles: Secondary | ICD-10-CM

## 2020-03-10 NOTE — Telephone Encounter (Signed)
CTA ordered per Dr. Antoine Poche. Communication with instructions sent to patient. General voicemail left for patient requesting she review mychart information and call back with questions.

## 2020-03-10 NOTE — Telephone Encounter (Signed)
-----   Message from Rollene Rotunda, MD sent at 03/09/2020  4:40 PM EDT ----- Called with results of aortic root enlargement.  She needs a CTA of the aorta to size this.  Trivial AI.  Please call her to arrange this.  Send results to Panosh, Neta Mends, MD

## 2020-03-14 DIAGNOSIS — J301 Allergic rhinitis due to pollen: Secondary | ICD-10-CM | POA: Diagnosis not present

## 2020-03-14 DIAGNOSIS — J3081 Allergic rhinitis due to animal (cat) (dog) hair and dander: Secondary | ICD-10-CM | POA: Diagnosis not present

## 2020-03-14 DIAGNOSIS — J3089 Other allergic rhinitis: Secondary | ICD-10-CM | POA: Diagnosis not present

## 2020-03-15 ENCOUNTER — Other Ambulatory Visit (HOSPITAL_COMMUNITY)
Admission: RE | Admit: 2020-03-15 | Discharge: 2020-03-15 | Disposition: A | Discharge status: Home / Self Care | Payer: BC Managed Care – PPO | Source: Ambulatory Visit | Attending: Cardiology | Admitting: Cardiology

## 2020-03-15 DIAGNOSIS — Z20822 Contact with and (suspected) exposure to covid-19: Secondary | ICD-10-CM | POA: Insufficient documentation

## 2020-03-15 DIAGNOSIS — Z01812 Encounter for preprocedural laboratory examination: Secondary | ICD-10-CM | POA: Diagnosis not present

## 2020-03-15 LAB — SARS CORONAVIRUS 2 (TAT 6-24 HRS): SARS Coronavirus 2: NEGATIVE

## 2020-03-16 ENCOUNTER — Telehealth (HOSPITAL_COMMUNITY): Payer: Self-pay

## 2020-03-16 NOTE — Telephone Encounter (Signed)
Encounter complete. 

## 2020-03-18 ENCOUNTER — Ambulatory Visit (HOSPITAL_COMMUNITY)
Admission: RE | Admit: 2020-03-18 | Discharge: 2020-03-18 | Disposition: A | Discharge status: Home / Self Care | Payer: BC Managed Care – PPO | Source: Ambulatory Visit | Attending: Cardiology | Admitting: Cardiology

## 2020-03-18 DIAGNOSIS — R931 Abnormal findings on diagnostic imaging of heart and coronary circulation: Secondary | ICD-10-CM | POA: Diagnosis not present

## 2020-03-18 LAB — EXERCISE TOLERANCE TEST
Estimated workload: 14.3 METS
Exercise duration (min): 12 min
Exercise duration (sec): 30 s
MPHR: 158 {beats}/min
Peak HR: 162 {beats}/min
Percent HR: 102 %
Rest HR: 63 {beats}/min

## 2020-03-21 DIAGNOSIS — J301 Allergic rhinitis due to pollen: Secondary | ICD-10-CM | POA: Diagnosis not present

## 2020-03-21 DIAGNOSIS — J3081 Allergic rhinitis due to animal (cat) (dog) hair and dander: Secondary | ICD-10-CM | POA: Diagnosis not present

## 2020-03-21 DIAGNOSIS — J3089 Other allergic rhinitis: Secondary | ICD-10-CM | POA: Diagnosis not present

## 2020-03-29 DIAGNOSIS — J301 Allergic rhinitis due to pollen: Secondary | ICD-10-CM | POA: Diagnosis not present

## 2020-03-29 DIAGNOSIS — J3089 Other allergic rhinitis: Secondary | ICD-10-CM | POA: Diagnosis not present

## 2020-03-31 ENCOUNTER — Other Ambulatory Visit: Payer: Self-pay | Admitting: *Deleted

## 2020-03-31 ENCOUNTER — Other Ambulatory Visit: Payer: Self-pay

## 2020-03-31 DIAGNOSIS — Z01812 Encounter for preprocedural laboratory examination: Secondary | ICD-10-CM

## 2020-04-01 ENCOUNTER — Telehealth: Payer: Self-pay | Admitting: *Deleted

## 2020-04-01 DIAGNOSIS — E875 Hyperkalemia: Secondary | ICD-10-CM

## 2020-04-01 LAB — BASIC METABOLIC PANEL
BUN/Creatinine Ratio: 14 (ref 12–28)
BUN: 9 mg/dL (ref 8–27)
CO2: 28 mmol/L (ref 20–29)
Calcium: 10.5 mg/dL — ABNORMAL HIGH (ref 8.7–10.3)
Chloride: 100 mmol/L (ref 96–106)
Creatinine, Ser: 0.65 mg/dL (ref 0.57–1.00)
GFR calc Af Amer: 110 mL/min/{1.73_m2} (ref >59)
GFR calc non Af Amer: 95 mL/min/{1.73_m2} (ref >59)
Glucose: 92 mg/dL (ref 65–99)
Potassium: 5.4 mmol/L — ABNORMAL HIGH (ref 3.5–5.2)
Sodium: 140 mmol/L (ref 134–144)

## 2020-04-01 NOTE — Telephone Encounter (Signed)
-----   Message from Rollene Rotunda, MD sent at 04/01/2020  1:20 PM EDT ----- Her potassium is minimally updated.  I do not see that she is on any supplements that contain potassium but I would have her check these.  She can reduce her potassium containing foods.  I like to just the potassium checked again in 2 weeks.

## 2020-04-01 NOTE — Telephone Encounter (Signed)
Advised patient, verbalized understanding Patient does not take any supplements

## 2020-04-04 DIAGNOSIS — J301 Allergic rhinitis due to pollen: Secondary | ICD-10-CM | POA: Diagnosis not present

## 2020-04-04 DIAGNOSIS — J3089 Other allergic rhinitis: Secondary | ICD-10-CM | POA: Diagnosis not present

## 2020-04-07 ENCOUNTER — Telehealth (HOSPITAL_COMMUNITY): Payer: Self-pay | Admitting: *Deleted

## 2020-04-07 NOTE — Telephone Encounter (Signed)
Attempted to call patient regarding upcoming cardiac CT appointment. Left message on voicemail with name and callback number  Tritia Endo Tai RN Navigator Cardiac Imaging [facility] [phone] Office [phone] Cell 

## 2020-04-07 NOTE — Telephone Encounter (Signed)
Pt returning call regarding upcoming cardiac imaging study; pt verbalizes understanding of appt date/time, parking situation and where to check in, pre-test NPO status and medications ordered, and verified current allergies; name and call back number provided for further questions should they arise  Merle Tai RN Navigator Cardiac Imaging [facility] [phone] office [phone] cell  

## 2020-04-08 ENCOUNTER — Ambulatory Visit (HOSPITAL_COMMUNITY)
Admission: RE | Admit: 2020-04-08 | Discharge: 2020-04-08 | Disposition: A | Discharge status: Home / Self Care | Payer: BC Managed Care – PPO | Source: Ambulatory Visit | Attending: Cardiology | Admitting: Cardiology

## 2020-04-08 ENCOUNTER — Other Ambulatory Visit: Payer: Self-pay

## 2020-04-08 DIAGNOSIS — I7789 Other specified disorders of arteries and arterioles: Secondary | ICD-10-CM | POA: Insufficient documentation

## 2020-04-08 MED ORDER — NITROGLYCERIN 0.4 MG SL SUBL
SUBLINGUAL_TABLET | SUBLINGUAL | Status: AC
  Filled 2020-04-08: qty 2

## 2020-04-08 MED ORDER — IOHEXOL 350 MG/ML SOLN: 100.0000 mL | Freq: Once | INTRAVENOUS | Status: DC | PRN

## 2020-04-08 MED ORDER — NITROGLYCERIN 0.4 MG SL SUBL
0.8000 mg | SUBLINGUAL_TABLET | Freq: Once | SUBLINGUAL | Status: AC
  Administered 2020-04-08: 0.8 mg via SUBLINGUAL

## 2020-04-08 MED ORDER — IOHEXOL 350 MG/ML SOLN
80.0000 mL | Freq: Once | INTRAVENOUS | Status: AC | PRN
  Administered 2020-04-08: 80 mL via INTRAVENOUS

## 2020-04-08 NOTE — Progress Notes (Signed)
CT scan completed. Tolerated well. D/C home ambulatory, awake and alert.

## 2020-04-12 DIAGNOSIS — J3089 Other allergic rhinitis: Secondary | ICD-10-CM | POA: Diagnosis not present

## 2020-04-12 DIAGNOSIS — J301 Allergic rhinitis due to pollen: Secondary | ICD-10-CM | POA: Diagnosis not present

## 2020-04-18 DIAGNOSIS — J301 Allergic rhinitis due to pollen: Secondary | ICD-10-CM | POA: Diagnosis not present

## 2020-04-18 DIAGNOSIS — J3089 Other allergic rhinitis: Secondary | ICD-10-CM | POA: Diagnosis not present

## 2020-04-25 DIAGNOSIS — J301 Allergic rhinitis due to pollen: Secondary | ICD-10-CM | POA: Diagnosis not present

## 2020-04-25 DIAGNOSIS — J3089 Other allergic rhinitis: Secondary | ICD-10-CM | POA: Diagnosis not present

## 2020-04-28 DIAGNOSIS — E875 Hyperkalemia: Secondary | ICD-10-CM | POA: Diagnosis not present

## 2020-04-29 LAB — BASIC METABOLIC PANEL
BUN/Creatinine Ratio: 21 (ref 12–28)
BUN: 13 mg/dL (ref 8–27)
CO2: 24 mmol/L (ref 20–29)
Calcium: 10.1 mg/dL (ref 8.7–10.3)
Chloride: 98 mmol/L (ref 96–106)
Creatinine, Ser: 0.61 mg/dL (ref 0.57–1.00)
GFR calc Af Amer: 112 mL/min/{1.73_m2} (ref >59)
GFR calc non Af Amer: 97 mL/min/{1.73_m2} (ref >59)
Glucose: 79 mg/dL (ref 65–99)
Potassium: 4.7 mmol/L (ref 3.5–5.2)
Sodium: 139 mmol/L (ref 134–144)

## 2020-05-02 DIAGNOSIS — J301 Allergic rhinitis due to pollen: Secondary | ICD-10-CM | POA: Diagnosis not present

## 2020-05-03 DIAGNOSIS — J3089 Other allergic rhinitis: Secondary | ICD-10-CM | POA: Diagnosis not present

## 2020-05-09 DIAGNOSIS — J3089 Other allergic rhinitis: Secondary | ICD-10-CM | POA: Diagnosis not present

## 2020-05-09 DIAGNOSIS — J301 Allergic rhinitis due to pollen: Secondary | ICD-10-CM | POA: Diagnosis not present

## 2020-05-12 DIAGNOSIS — J3089 Other allergic rhinitis: Secondary | ICD-10-CM | POA: Diagnosis not present

## 2020-05-12 DIAGNOSIS — J301 Allergic rhinitis due to pollen: Secondary | ICD-10-CM | POA: Diagnosis not present

## 2020-05-16 DIAGNOSIS — J301 Allergic rhinitis due to pollen: Secondary | ICD-10-CM | POA: Diagnosis not present

## 2020-05-16 DIAGNOSIS — J3089 Other allergic rhinitis: Secondary | ICD-10-CM | POA: Diagnosis not present

## 2020-05-17 DIAGNOSIS — Z681 Body mass index (BMI) 19 or less, adult: Secondary | ICD-10-CM | POA: Diagnosis not present

## 2020-05-17 DIAGNOSIS — Z1231 Encounter for screening mammogram for malignant neoplasm of breast: Secondary | ICD-10-CM | POA: Diagnosis not present

## 2020-05-17 DIAGNOSIS — Z01419 Encounter for gynecological examination (general) (routine) without abnormal findings: Secondary | ICD-10-CM | POA: Diagnosis not present

## 2020-05-18 DIAGNOSIS — J301 Allergic rhinitis due to pollen: Secondary | ICD-10-CM | POA: Diagnosis not present

## 2020-05-18 DIAGNOSIS — J3089 Other allergic rhinitis: Secondary | ICD-10-CM | POA: Diagnosis not present

## 2020-05-23 DIAGNOSIS — J3089 Other allergic rhinitis: Secondary | ICD-10-CM | POA: Diagnosis not present

## 2020-05-23 DIAGNOSIS — J301 Allergic rhinitis due to pollen: Secondary | ICD-10-CM | POA: Diagnosis not present

## 2020-05-31 DIAGNOSIS — J301 Allergic rhinitis due to pollen: Secondary | ICD-10-CM | POA: Diagnosis not present

## 2020-05-31 DIAGNOSIS — J3089 Other allergic rhinitis: Secondary | ICD-10-CM | POA: Diagnosis not present

## 2020-06-06 DIAGNOSIS — J301 Allergic rhinitis due to pollen: Secondary | ICD-10-CM | POA: Diagnosis not present

## 2020-06-06 DIAGNOSIS — J3089 Other allergic rhinitis: Secondary | ICD-10-CM | POA: Diagnosis not present

## 2020-06-14 DIAGNOSIS — J3089 Other allergic rhinitis: Secondary | ICD-10-CM | POA: Diagnosis not present

## 2020-06-14 DIAGNOSIS — J301 Allergic rhinitis due to pollen: Secondary | ICD-10-CM | POA: Diagnosis not present

## 2020-06-20 DIAGNOSIS — J301 Allergic rhinitis due to pollen: Secondary | ICD-10-CM | POA: Diagnosis not present

## 2020-06-20 DIAGNOSIS — J3089 Other allergic rhinitis: Secondary | ICD-10-CM | POA: Diagnosis not present

## 2020-06-27 DIAGNOSIS — J301 Allergic rhinitis due to pollen: Secondary | ICD-10-CM | POA: Diagnosis not present

## 2020-06-27 DIAGNOSIS — J3089 Other allergic rhinitis: Secondary | ICD-10-CM | POA: Diagnosis not present

## 2020-07-04 DIAGNOSIS — J301 Allergic rhinitis due to pollen: Secondary | ICD-10-CM | POA: Diagnosis not present

## 2020-07-04 DIAGNOSIS — J3089 Other allergic rhinitis: Secondary | ICD-10-CM | POA: Diagnosis not present

## 2020-07-11 DIAGNOSIS — J3089 Other allergic rhinitis: Secondary | ICD-10-CM | POA: Diagnosis not present

## 2020-07-11 DIAGNOSIS — J3081 Allergic rhinitis due to animal (cat) (dog) hair and dander: Secondary | ICD-10-CM | POA: Diagnosis not present

## 2020-07-11 DIAGNOSIS — J301 Allergic rhinitis due to pollen: Secondary | ICD-10-CM | POA: Diagnosis not present

## 2020-07-19 DIAGNOSIS — J3089 Other allergic rhinitis: Secondary | ICD-10-CM | POA: Diagnosis not present

## 2020-07-25 DIAGNOSIS — J301 Allergic rhinitis due to pollen: Secondary | ICD-10-CM | POA: Diagnosis not present

## 2020-07-25 DIAGNOSIS — J3089 Other allergic rhinitis: Secondary | ICD-10-CM | POA: Diagnosis not present

## 2020-07-25 DIAGNOSIS — J3081 Allergic rhinitis due to animal (cat) (dog) hair and dander: Secondary | ICD-10-CM | POA: Diagnosis not present

## 2020-08-02 DIAGNOSIS — J3089 Other allergic rhinitis: Secondary | ICD-10-CM | POA: Diagnosis not present

## 2020-08-02 DIAGNOSIS — J301 Allergic rhinitis due to pollen: Secondary | ICD-10-CM | POA: Diagnosis not present

## 2020-08-08 DIAGNOSIS — J3089 Other allergic rhinitis: Secondary | ICD-10-CM | POA: Diagnosis not present

## 2020-08-08 DIAGNOSIS — J301 Allergic rhinitis due to pollen: Secondary | ICD-10-CM | POA: Diagnosis not present

## 2020-08-15 DIAGNOSIS — J3089 Other allergic rhinitis: Secondary | ICD-10-CM | POA: Diagnosis not present

## 2020-08-15 DIAGNOSIS — J301 Allergic rhinitis due to pollen: Secondary | ICD-10-CM | POA: Diagnosis not present

## 2020-08-16 MED ORDER — ROSUVASTATIN CALCIUM 10 MG PO TABS: 10.0000 mg | ORAL_TABLET | Freq: Every day | ORAL | 3 refills | Status: DC

## 2020-08-22 DIAGNOSIS — J301 Allergic rhinitis due to pollen: Secondary | ICD-10-CM | POA: Diagnosis not present

## 2020-08-22 DIAGNOSIS — J3089 Other allergic rhinitis: Secondary | ICD-10-CM | POA: Diagnosis not present

## 2020-08-29 DIAGNOSIS — J3089 Other allergic rhinitis: Secondary | ICD-10-CM | POA: Diagnosis not present

## 2020-08-29 DIAGNOSIS — J301 Allergic rhinitis due to pollen: Secondary | ICD-10-CM | POA: Diagnosis not present

## 2020-08-30 DIAGNOSIS — J301 Allergic rhinitis due to pollen: Secondary | ICD-10-CM | POA: Diagnosis not present

## 2020-08-31 DIAGNOSIS — J3089 Other allergic rhinitis: Secondary | ICD-10-CM | POA: Diagnosis not present

## 2020-09-05 DIAGNOSIS — J301 Allergic rhinitis due to pollen: Secondary | ICD-10-CM | POA: Diagnosis not present

## 2020-09-05 DIAGNOSIS — J3081 Allergic rhinitis due to animal (cat) (dog) hair and dander: Secondary | ICD-10-CM | POA: Diagnosis not present

## 2020-09-05 DIAGNOSIS — J3089 Other allergic rhinitis: Secondary | ICD-10-CM | POA: Diagnosis not present

## 2020-09-09 DIAGNOSIS — R0602 Shortness of breath: Secondary | ICD-10-CM | POA: Diagnosis not present

## 2020-09-09 DIAGNOSIS — J3089 Other allergic rhinitis: Secondary | ICD-10-CM | POA: Diagnosis not present

## 2020-09-09 DIAGNOSIS — H1045 Other chronic allergic conjunctivitis: Secondary | ICD-10-CM | POA: Diagnosis not present

## 2020-09-09 DIAGNOSIS — J301 Allergic rhinitis due to pollen: Secondary | ICD-10-CM | POA: Diagnosis not present

## 2020-09-12 DIAGNOSIS — J301 Allergic rhinitis due to pollen: Secondary | ICD-10-CM | POA: Diagnosis not present

## 2020-09-12 DIAGNOSIS — J3089 Other allergic rhinitis: Secondary | ICD-10-CM | POA: Diagnosis not present

## 2020-09-19 DIAGNOSIS — J301 Allergic rhinitis due to pollen: Secondary | ICD-10-CM | POA: Diagnosis not present

## 2020-09-19 DIAGNOSIS — J3089 Other allergic rhinitis: Secondary | ICD-10-CM | POA: Diagnosis not present

## 2020-09-26 DIAGNOSIS — J301 Allergic rhinitis due to pollen: Secondary | ICD-10-CM | POA: Diagnosis not present

## 2020-09-26 DIAGNOSIS — J3089 Other allergic rhinitis: Secondary | ICD-10-CM | POA: Diagnosis not present

## 2020-10-03 DIAGNOSIS — J3089 Other allergic rhinitis: Secondary | ICD-10-CM | POA: Diagnosis not present

## 2020-10-03 DIAGNOSIS — J301 Allergic rhinitis due to pollen: Secondary | ICD-10-CM | POA: Diagnosis not present

## 2020-10-03 DIAGNOSIS — J3081 Allergic rhinitis due to animal (cat) (dog) hair and dander: Secondary | ICD-10-CM | POA: Diagnosis not present

## 2020-10-05 DIAGNOSIS — J301 Allergic rhinitis due to pollen: Secondary | ICD-10-CM | POA: Diagnosis not present

## 2020-10-05 DIAGNOSIS — J3089 Other allergic rhinitis: Secondary | ICD-10-CM | POA: Diagnosis not present

## 2020-10-10 DIAGNOSIS — J3081 Allergic rhinitis due to animal (cat) (dog) hair and dander: Secondary | ICD-10-CM | POA: Diagnosis not present

## 2020-10-10 DIAGNOSIS — J301 Allergic rhinitis due to pollen: Secondary | ICD-10-CM | POA: Diagnosis not present

## 2020-10-10 DIAGNOSIS — J3089 Other allergic rhinitis: Secondary | ICD-10-CM | POA: Diagnosis not present

## 2020-10-12 DIAGNOSIS — J301 Allergic rhinitis due to pollen: Secondary | ICD-10-CM | POA: Diagnosis not present

## 2020-10-12 DIAGNOSIS — J3089 Other allergic rhinitis: Secondary | ICD-10-CM | POA: Diagnosis not present

## 2020-10-17 DIAGNOSIS — J3089 Other allergic rhinitis: Secondary | ICD-10-CM | POA: Diagnosis not present

## 2020-10-17 DIAGNOSIS — J301 Allergic rhinitis due to pollen: Secondary | ICD-10-CM | POA: Diagnosis not present

## 2020-10-25 DIAGNOSIS — J301 Allergic rhinitis due to pollen: Secondary | ICD-10-CM | POA: Diagnosis not present

## 2020-10-25 DIAGNOSIS — J3089 Other allergic rhinitis: Secondary | ICD-10-CM | POA: Diagnosis not present

## 2020-10-31 DIAGNOSIS — J3089 Other allergic rhinitis: Secondary | ICD-10-CM | POA: Diagnosis not present

## 2020-10-31 DIAGNOSIS — J301 Allergic rhinitis due to pollen: Secondary | ICD-10-CM | POA: Diagnosis not present

## 2020-11-07 DIAGNOSIS — J301 Allergic rhinitis due to pollen: Secondary | ICD-10-CM | POA: Diagnosis not present

## 2020-11-07 DIAGNOSIS — J3089 Other allergic rhinitis: Secondary | ICD-10-CM | POA: Diagnosis not present

## 2020-11-14 DIAGNOSIS — J301 Allergic rhinitis due to pollen: Secondary | ICD-10-CM | POA: Diagnosis not present

## 2020-11-14 DIAGNOSIS — J3089 Other allergic rhinitis: Secondary | ICD-10-CM | POA: Diagnosis not present

## 2020-11-21 DIAGNOSIS — J3081 Allergic rhinitis due to animal (cat) (dog) hair and dander: Secondary | ICD-10-CM | POA: Diagnosis not present

## 2020-11-21 DIAGNOSIS — J301 Allergic rhinitis due to pollen: Secondary | ICD-10-CM | POA: Diagnosis not present

## 2020-11-21 DIAGNOSIS — J3089 Other allergic rhinitis: Secondary | ICD-10-CM | POA: Diagnosis not present

## 2020-11-28 DIAGNOSIS — J301 Allergic rhinitis due to pollen: Secondary | ICD-10-CM | POA: Diagnosis not present

## 2020-11-28 DIAGNOSIS — J3089 Other allergic rhinitis: Secondary | ICD-10-CM | POA: Diagnosis not present

## 2020-12-06 DIAGNOSIS — J3089 Other allergic rhinitis: Secondary | ICD-10-CM | POA: Diagnosis not present

## 2020-12-06 DIAGNOSIS — J301 Allergic rhinitis due to pollen: Secondary | ICD-10-CM | POA: Diagnosis not present

## 2020-12-12 DIAGNOSIS — J3089 Other allergic rhinitis: Secondary | ICD-10-CM | POA: Diagnosis not present

## 2020-12-12 DIAGNOSIS — J301 Allergic rhinitis due to pollen: Secondary | ICD-10-CM | POA: Diagnosis not present

## 2020-12-19 DIAGNOSIS — J301 Allergic rhinitis due to pollen: Secondary | ICD-10-CM | POA: Diagnosis not present

## 2020-12-19 DIAGNOSIS — J3089 Other allergic rhinitis: Secondary | ICD-10-CM | POA: Diagnosis not present

## 2020-12-27 DIAGNOSIS — J301 Allergic rhinitis due to pollen: Secondary | ICD-10-CM | POA: Diagnosis not present

## 2020-12-27 DIAGNOSIS — J3089 Other allergic rhinitis: Secondary | ICD-10-CM | POA: Diagnosis not present

## 2021-01-02 DIAGNOSIS — J301 Allergic rhinitis due to pollen: Secondary | ICD-10-CM | POA: Diagnosis not present

## 2021-01-02 DIAGNOSIS — J3089 Other allergic rhinitis: Secondary | ICD-10-CM | POA: Diagnosis not present

## 2021-01-09 DIAGNOSIS — J301 Allergic rhinitis due to pollen: Secondary | ICD-10-CM | POA: Diagnosis not present

## 2021-01-09 DIAGNOSIS — J3089 Other allergic rhinitis: Secondary | ICD-10-CM | POA: Diagnosis not present

## 2021-01-16 DIAGNOSIS — J3089 Other allergic rhinitis: Secondary | ICD-10-CM | POA: Diagnosis not present

## 2021-01-16 DIAGNOSIS — J301 Allergic rhinitis due to pollen: Secondary | ICD-10-CM | POA: Diagnosis not present

## 2021-01-23 DIAGNOSIS — J3081 Allergic rhinitis due to animal (cat) (dog) hair and dander: Secondary | ICD-10-CM | POA: Diagnosis not present

## 2021-01-23 DIAGNOSIS — J301 Allergic rhinitis due to pollen: Secondary | ICD-10-CM | POA: Diagnosis not present

## 2021-01-23 DIAGNOSIS — J3089 Other allergic rhinitis: Secondary | ICD-10-CM | POA: Diagnosis not present

## 2021-01-30 DIAGNOSIS — J3089 Other allergic rhinitis: Secondary | ICD-10-CM | POA: Diagnosis not present

## 2021-01-30 DIAGNOSIS — J301 Allergic rhinitis due to pollen: Secondary | ICD-10-CM | POA: Diagnosis not present

## 2021-01-30 DIAGNOSIS — J3081 Allergic rhinitis due to animal (cat) (dog) hair and dander: Secondary | ICD-10-CM | POA: Diagnosis not present

## 2021-02-06 DIAGNOSIS — J301 Allergic rhinitis due to pollen: Secondary | ICD-10-CM | POA: Diagnosis not present

## 2021-02-06 DIAGNOSIS — J3089 Other allergic rhinitis: Secondary | ICD-10-CM | POA: Diagnosis not present

## 2021-02-08 DIAGNOSIS — J301 Allergic rhinitis due to pollen: Secondary | ICD-10-CM | POA: Diagnosis not present

## 2021-02-09 DIAGNOSIS — J3089 Other allergic rhinitis: Secondary | ICD-10-CM | POA: Diagnosis not present

## 2021-02-13 DIAGNOSIS — J301 Allergic rhinitis due to pollen: Secondary | ICD-10-CM | POA: Diagnosis not present

## 2021-02-13 DIAGNOSIS — J3089 Other allergic rhinitis: Secondary | ICD-10-CM | POA: Diagnosis not present

## 2021-02-15 ENCOUNTER — Other Ambulatory Visit (HOSPITAL_COMMUNITY): Payer: Self-pay | Admitting: *Deleted

## 2021-02-20 DIAGNOSIS — J301 Allergic rhinitis due to pollen: Secondary | ICD-10-CM | POA: Diagnosis not present

## 2021-02-20 DIAGNOSIS — J3089 Other allergic rhinitis: Secondary | ICD-10-CM | POA: Diagnosis not present

## 2021-02-22 ENCOUNTER — Other Ambulatory Visit: Payer: Self-pay

## 2021-02-22 ENCOUNTER — Ambulatory Visit (HOSPITAL_BASED_OUTPATIENT_CLINIC_OR_DEPARTMENT_OTHER)
Admission: RE | Admit: 2021-02-22 | Discharge: 2021-02-22 | Disposition: A | Discharge status: Home / Self Care | Payer: BC Managed Care – PPO | Source: Ambulatory Visit | Attending: Cardiology | Admitting: Cardiology

## 2021-02-27 DIAGNOSIS — J301 Allergic rhinitis due to pollen: Secondary | ICD-10-CM | POA: Diagnosis not present

## 2021-02-27 DIAGNOSIS — J3089 Other allergic rhinitis: Secondary | ICD-10-CM | POA: Diagnosis not present

## 2021-03-07 DIAGNOSIS — J301 Allergic rhinitis due to pollen: Secondary | ICD-10-CM | POA: Diagnosis not present

## 2021-03-07 DIAGNOSIS — J3089 Other allergic rhinitis: Secondary | ICD-10-CM | POA: Diagnosis not present

## 2021-03-13 DIAGNOSIS — J3089 Other allergic rhinitis: Secondary | ICD-10-CM | POA: Diagnosis not present

## 2021-03-13 DIAGNOSIS — J301 Allergic rhinitis due to pollen: Secondary | ICD-10-CM | POA: Diagnosis not present

## 2021-03-20 DIAGNOSIS — J3089 Other allergic rhinitis: Secondary | ICD-10-CM | POA: Diagnosis not present

## 2021-03-20 DIAGNOSIS — J301 Allergic rhinitis due to pollen: Secondary | ICD-10-CM | POA: Diagnosis not present

## 2021-03-23 DIAGNOSIS — I7789 Other specified disorders of arteries and arterioles: Secondary | ICD-10-CM | POA: Insufficient documentation

## 2021-03-23 NOTE — Progress Notes (Signed)
Cardiology Office Note   Date:  03/26/2021   ID:  Tracey Patrick, DOB Mar 07, 1956, MRN [id number]  PCP:  Madelin Headings, Patrick  Cardiologist:   None Referring:  Madelin Headings, Patrick  Chief Complaint  Patient presents with   Elevated coronary calcium       History of Present Illness: Tracey Patrick is a 65 y.o. female who is referred by Madelin Headings, Patrick for evaluation of cardiovascular risk factors and an elevated coronary calcium score of 4 which was 64 percentile for her age and gender.  She had a negative POET (Plain Old Exercise Treadmill) last year.  She had an enlarged aorta at 42  mm.      She had no new cardiovascular complaints since I saw her. The patient denies any new symptoms such as chest discomfort, neck or arm discomfort. There has been no new shortness of breath, PND or orthopnea. There have been no reported palpitations, presyncope or syncope.    Past Medical History:  Diagnosis Date   CHEST PAIN, ATYPICAL 09/24/2008   Qualifier: Diagnosis of  By: Tracey Patrick, CMA (AAMA), Tracey Patrick    COLONIC POLYPS, HX OF 10/04/2008   Qualifier: Diagnosis of  By: Tracey Patrick, Tracey Patrick    Desensitization to allergy shot    Tracey Patrick  2016   Endometriosis    ENDOMETRIOSIS 09/24/2008   Qualifier: Diagnosis of  By: Tracey Patrick, CMA (AAMA), Tracey Patrick    HEMORRHOIDS 09/24/2008   Qualifier: Diagnosis of  By: Tracey Patrick, CMA (AAMA), Tracey Patrick    SCOLIOSIS 09/24/2008   Qualifier: Diagnosis of  By: Tracey Patrick, CMA (AAMA), Tracey Patrick     Past Surgical History:  Procedure Laterality Date   ENDOMETRIAL ABLATION  2004   LIPOSUCTION  2019   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 500 MG OF CALCIUM     co-enzyme Q-10 50 MG capsule Take 50 mg by mouth daily.     cycloSPORINE (RESTASIS) 0.05 % ophthalmic emulsion Place 1 drop into both eyes 2 (two) times daily.      docusate sodium (COLACE) 100 MG capsule Take 100 mg by mouth daily.      Ferrous Fumarate (IRON) 18 MG TBCR Take by mouth.     Garlic 300 MG CAPS Take 1 capsule by mouth daily.     Lutein 20 MG CAPS Take 20 mg by mouth daily.     Multiple Vitamin (MULTIVITAMIN) tablet Take 1 tablet by mouth daily.     Omega-3 Fatty Acids (FISH OIL PO) Take 1 g by mouth daily.     OVER THE COUNTER MEDICATION ZEAXANTHIN - 2 MG DAILY     PRESCRIPTION MEDICATION ALLERGY SHOTS     Psyllium (METAMUCIL FIBER PO) Take 20 g by mouth daily.     rosuvastatin (CRESTOR) 10 MG tablet Take 1 tablet (10 mg total) by mouth at bedtime. 90 tablet 3   No current facility-administered medications for this visit.    Allergies:   Patient has no known allergies.    Social History:  The patient  reports that she has never smoked. She has never used smokeless tobacco. She reports current alcohol use of about 6.0 standard drinks of alcohol per week. She reports that she does not use drugs.   Family History:  The patient'Patrick family history includes Alcohol abuse in  her paternal grandfather; Breast cancer in her mother; CVA in her father; Heart attack (age of onset: 10) in her mother; Hyperlipidemia in her brother, father, and mother; Macular degeneration in her father.    ROS:  Please see the history of present illness.   Otherwise, review of systems are positive for none.   All other systems are reviewed and negative.    PHYSICAL EXAM: VS:  BP 110/72 (BP Location: Left Arm, Patient Position: Sitting, Cuff Size: Normal)   Pulse (!) 52   Resp 18   Ht 5\' 2"  (1.575 m)   Wt 108 lb 9.6 oz (49.3 kg)   SpO2 100%   BMI 19.86 kg/m  , BMI Body mass index is 19.86 kg/m. GENERAL:  Well appearing HEENT:  Pupils equal round and reactive, fundi not visualized, oral mucosa unremarkable NECK:  No jugular venous distention, waveform within normal limits, carotid upstroke brisk and symmetric, no bruits, no thyromegaly LYMPHATICS:  No cervical,  inguinal adenopathy LUNGS:  Clear to auscultation bilaterally BACK:  No CVA tenderness CHEST:  Unremarkable HEART:  PMI not displaced or sustained,S1 and S2 within normal limits, no S3, no S4, no clicks, no rubs, 2 out of 6 diastolic murmur heard best at the apex and proceeding well into diastole, no systolic murmurs ABD:  Flat, positive bowel sounds normal in frequency in pitch, no bruits, no rebound, no guarding, no midline pulsatile mass, no hepatomegaly, no splenomegaly EXT:  2 plus pulses throughout, no edema, no cyanosis no clubbing SKIN:  No rashes no nodules NEURO:  Cranial nerves II through XII grossly intact, motor grossly intact throughout PSYCH:  Cognitively intact, oriented to person place and time    EKG:  EKG is ordered today. The ekg ordered today demonstrates sinus rhythm, rate 52, axis within normal limits, intervals within normal limits, no acute ST-T wave changes.   Recent Labs: 04/28/2020: BUN 13; Creatinine, Ser 0.61; Potassium 4.7; Sodium 139    Lipid Panel    Component Value Date/Time   CHOL 207 (A) 12/23/2019 0000   TRIG 52 12/23/2019 0000   HDL 108 (A) 12/23/2019 0000   CHOLHDL 2.3 CALC 09/24/2008 0857   VLDL 6 09/24/2008 0857   LDLCALC 89 12/23/2019 0000   LDLDIRECT 109.2 09/24/2008 0857      Wt Readings from Last 3 Encounters:  03/24/21 108 lb 9.6 oz (49.3 kg)  02/12/20 107 lb (48.5 kg)  01/15/20 106 lb 9.6 oz (48.4 kg)      Other studies Reviewed: Additional studies/ records that were reviewed today include: Labs, CT. Review of the above records demonstrates:  Please see elsewhere in the note.     ASSESSMENT AND PLAN:   ELEVATED CORONARY CALCIUM SCORE:   She had no new symptoms.  No change in therapy is indicated.  DYSLIPIDEMIA:   She is going to reduce the dose of statin because she is not noticing a difference.  AORTIC ROOT ENLARGEMENT: She needs a repeat CT of her aorta with contrast and we will arrange this.  Current medicines are  reviewed at length with the patient today.  The patient does not have concerns regarding medicines.  The following changes have been made:  no change  Labs/ tests ordered today include:   Orders Placed This Encounter  Procedures   CT ANGIO CHEST AORTA W/CM & OR WO/CM   EKG 12-Lead      Disposition:   FU with me in 1 year  Signed, 03/16/20, Patrick  03/26/2021 3:12  PM    [facility]

## 2021-03-24 ENCOUNTER — Encounter: Payer: Self-pay | Admitting: Cardiology

## 2021-03-24 ENCOUNTER — Other Ambulatory Visit: Payer: Self-pay

## 2021-03-24 ENCOUNTER — Ambulatory Visit: Payer: BC Managed Care – PPO | Admitting: Cardiology

## 2021-03-24 VITALS — BP 110/72 | HR 52 | Resp 18 | Ht 62.0 in | Wt 108.6 lb

## 2021-03-24 DIAGNOSIS — E785 Hyperlipidemia, unspecified: Secondary | ICD-10-CM

## 2021-03-24 DIAGNOSIS — R931 Abnormal findings on diagnostic imaging of heart and coronary circulation: Secondary | ICD-10-CM

## 2021-03-24 DIAGNOSIS — I7789 Other specified disorders of arteries and arterioles: Secondary | ICD-10-CM | POA: Diagnosis not present

## 2021-03-24 NOTE — Patient Instructions (Signed)
Medication Instructions:  Your physician recommends that you continue on your current medications as directed. Please refer to the Current Medication list given to you today.  *If you need a refill on your cardiac medications before your next appointment, please call your pharmacy*   Lab Work: None ordered.     Testing/Procedures: Your physician has requested that you have aortic CT. Cardiac computed tomography (CT) is a painless test that uses an x-ray machine to take clear, detailed pictures of your heart. For further information please visit [email or web address] Please follow instruction sheet as given.   Follow-Up: At [facility], you and your health needs are our priority.  As part of our continuing mission to provide you with exceptional heart care, we have created designated Provider Care Teams.  These Care Teams include your primary Cardiologist (physician) and Advanced Practice Providers (APPs -  Physician Assistants and Nurse Practitioners) who all work together to provide you with the care you need, when you need it.  We recommend signing up for the patient portal called "MyChart".  Sign up information is provided on this After Visit Summary.  MyChart is used to connect with patients for Virtual Visits (Telemedicine).  Patients are able to view lab/test results, encounter notes, upcoming appointments, etc.  Non-urgent messages can be sent to your provider as well.   To learn more about what you can do with MyChart, go to [email or web address].    Your next appointment:   12 month(s)  The format for your next appointment:   In Person  Provider:   Rollene Rotunda, MD

## 2021-03-26 ENCOUNTER — Encounter: Payer: Self-pay | Admitting: Cardiology

## 2021-03-27 DIAGNOSIS — J3089 Other allergic rhinitis: Secondary | ICD-10-CM | POA: Diagnosis not present

## 2021-03-27 DIAGNOSIS — J301 Allergic rhinitis due to pollen: Secondary | ICD-10-CM | POA: Diagnosis not present

## 2021-04-03 DIAGNOSIS — J301 Allergic rhinitis due to pollen: Secondary | ICD-10-CM | POA: Diagnosis not present

## 2021-04-03 DIAGNOSIS — J3089 Other allergic rhinitis: Secondary | ICD-10-CM | POA: Diagnosis not present

## 2021-04-06 DIAGNOSIS — J301 Allergic rhinitis due to pollen: Secondary | ICD-10-CM | POA: Diagnosis not present

## 2021-04-06 DIAGNOSIS — J3089 Other allergic rhinitis: Secondary | ICD-10-CM | POA: Diagnosis not present

## 2021-04-11 DIAGNOSIS — J3089 Other allergic rhinitis: Secondary | ICD-10-CM | POA: Diagnosis not present

## 2021-04-11 DIAGNOSIS — J301 Allergic rhinitis due to pollen: Secondary | ICD-10-CM | POA: Diagnosis not present

## 2021-04-13 DIAGNOSIS — J3089 Other allergic rhinitis: Secondary | ICD-10-CM | POA: Diagnosis not present

## 2021-04-13 DIAGNOSIS — J301 Allergic rhinitis due to pollen: Secondary | ICD-10-CM | POA: Diagnosis not present

## 2021-04-17 DIAGNOSIS — J3089 Other allergic rhinitis: Secondary | ICD-10-CM | POA: Diagnosis not present

## 2021-04-17 DIAGNOSIS — J301 Allergic rhinitis due to pollen: Secondary | ICD-10-CM | POA: Diagnosis not present

## 2021-04-24 DIAGNOSIS — J301 Allergic rhinitis due to pollen: Secondary | ICD-10-CM | POA: Diagnosis not present

## 2021-04-24 DIAGNOSIS — J3081 Allergic rhinitis due to animal (cat) (dog) hair and dander: Secondary | ICD-10-CM | POA: Diagnosis not present

## 2021-05-02 DIAGNOSIS — J3089 Other allergic rhinitis: Secondary | ICD-10-CM | POA: Diagnosis not present

## 2021-05-02 DIAGNOSIS — J301 Allergic rhinitis due to pollen: Secondary | ICD-10-CM | POA: Diagnosis not present

## 2021-05-04 DIAGNOSIS — J3089 Other allergic rhinitis: Secondary | ICD-10-CM | POA: Diagnosis not present

## 2021-05-04 DIAGNOSIS — J301 Allergic rhinitis due to pollen: Secondary | ICD-10-CM | POA: Diagnosis not present

## 2021-05-08 DIAGNOSIS — J3081 Allergic rhinitis due to animal (cat) (dog) hair and dander: Secondary | ICD-10-CM | POA: Diagnosis not present

## 2021-05-08 DIAGNOSIS — J301 Allergic rhinitis due to pollen: Secondary | ICD-10-CM | POA: Diagnosis not present

## 2021-05-08 DIAGNOSIS — J3089 Other allergic rhinitis: Secondary | ICD-10-CM | POA: Diagnosis not present

## 2021-05-10 DIAGNOSIS — J3089 Other allergic rhinitis: Secondary | ICD-10-CM | POA: Diagnosis not present

## 2021-05-10 DIAGNOSIS — J301 Allergic rhinitis due to pollen: Secondary | ICD-10-CM | POA: Diagnosis not present

## 2021-05-15 DIAGNOSIS — J301 Allergic rhinitis due to pollen: Secondary | ICD-10-CM | POA: Diagnosis not present

## 2021-05-15 DIAGNOSIS — J3089 Other allergic rhinitis: Secondary | ICD-10-CM | POA: Diagnosis not present

## 2021-05-22 DIAGNOSIS — J3089 Other allergic rhinitis: Secondary | ICD-10-CM | POA: Diagnosis not present

## 2021-05-22 DIAGNOSIS — J301 Allergic rhinitis due to pollen: Secondary | ICD-10-CM | POA: Diagnosis not present

## 2021-05-29 DIAGNOSIS — J3089 Other allergic rhinitis: Secondary | ICD-10-CM | POA: Diagnosis not present

## 2021-05-29 DIAGNOSIS — J301 Allergic rhinitis due to pollen: Secondary | ICD-10-CM | POA: Diagnosis not present

## 2021-06-05 DIAGNOSIS — J3089 Other allergic rhinitis: Secondary | ICD-10-CM | POA: Diagnosis not present

## 2021-06-05 DIAGNOSIS — J301 Allergic rhinitis due to pollen: Secondary | ICD-10-CM | POA: Diagnosis not present

## 2021-06-13 DIAGNOSIS — J301 Allergic rhinitis due to pollen: Secondary | ICD-10-CM | POA: Diagnosis not present

## 2021-06-13 DIAGNOSIS — J3089 Other allergic rhinitis: Secondary | ICD-10-CM | POA: Diagnosis not present

## 2021-06-19 DIAGNOSIS — J3089 Other allergic rhinitis: Secondary | ICD-10-CM | POA: Diagnosis not present

## 2021-06-19 DIAGNOSIS — J301 Allergic rhinitis due to pollen: Secondary | ICD-10-CM | POA: Diagnosis not present

## 2021-06-26 DIAGNOSIS — J301 Allergic rhinitis due to pollen: Secondary | ICD-10-CM | POA: Diagnosis not present

## 2021-06-26 DIAGNOSIS — J3089 Other allergic rhinitis: Secondary | ICD-10-CM | POA: Diagnosis not present

## 2021-07-03 DIAGNOSIS — J301 Allergic rhinitis due to pollen: Secondary | ICD-10-CM | POA: Diagnosis not present

## 2021-07-03 DIAGNOSIS — J3089 Other allergic rhinitis: Secondary | ICD-10-CM | POA: Diagnosis not present

## 2021-07-10 DIAGNOSIS — J3081 Allergic rhinitis due to animal (cat) (dog) hair and dander: Secondary | ICD-10-CM | POA: Diagnosis not present

## 2021-07-10 DIAGNOSIS — J3089 Other allergic rhinitis: Secondary | ICD-10-CM | POA: Diagnosis not present

## 2021-07-10 DIAGNOSIS — J301 Allergic rhinitis due to pollen: Secondary | ICD-10-CM | POA: Diagnosis not present

## 2021-07-20 DIAGNOSIS — J301 Allergic rhinitis due to pollen: Secondary | ICD-10-CM | POA: Diagnosis not present

## 2021-07-20 DIAGNOSIS — J3089 Other allergic rhinitis: Secondary | ICD-10-CM | POA: Diagnosis not present

## 2021-07-21 DIAGNOSIS — Z1231 Encounter for screening mammogram for malignant neoplasm of breast: Secondary | ICD-10-CM | POA: Diagnosis not present

## 2021-07-21 DIAGNOSIS — Z01419 Encounter for gynecological examination (general) (routine) without abnormal findings: Secondary | ICD-10-CM | POA: Diagnosis not present

## 2021-07-21 DIAGNOSIS — Z6820 Body mass index (BMI) 20.0-20.9, adult: Secondary | ICD-10-CM | POA: Diagnosis not present

## 2021-07-21 LAB — HM MAMMOGRAPHY

## 2021-07-21 LAB — HM PAP SMEAR: HM Pap smear: NORMAL

## 2021-07-24 DIAGNOSIS — J3089 Other allergic rhinitis: Secondary | ICD-10-CM | POA: Diagnosis not present

## 2021-07-24 DIAGNOSIS — J301 Allergic rhinitis due to pollen: Secondary | ICD-10-CM | POA: Diagnosis not present

## 2021-07-25 ENCOUNTER — Other Ambulatory Visit: Payer: Self-pay | Admitting: *Deleted

## 2021-07-25 DIAGNOSIS — I7122 Aneurysm of the aortic arch, without rupture: Secondary | ICD-10-CM

## 2021-08-01 DIAGNOSIS — J301 Allergic rhinitis due to pollen: Secondary | ICD-10-CM | POA: Diagnosis not present

## 2021-08-01 DIAGNOSIS — J3089 Other allergic rhinitis: Secondary | ICD-10-CM | POA: Diagnosis not present

## 2021-08-03 DIAGNOSIS — J3089 Other allergic rhinitis: Secondary | ICD-10-CM | POA: Diagnosis not present

## 2021-08-07 ENCOUNTER — Encounter: Payer: Self-pay | Admitting: Internal Medicine

## 2021-08-07 DIAGNOSIS — J3089 Other allergic rhinitis: Secondary | ICD-10-CM | POA: Diagnosis not present

## 2021-08-07 DIAGNOSIS — J301 Allergic rhinitis due to pollen: Secondary | ICD-10-CM | POA: Diagnosis not present

## 2021-08-14 DIAGNOSIS — J301 Allergic rhinitis due to pollen: Secondary | ICD-10-CM | POA: Diagnosis not present

## 2021-08-14 DIAGNOSIS — J3089 Other allergic rhinitis: Secondary | ICD-10-CM | POA: Diagnosis not present

## 2021-08-16 DIAGNOSIS — J301 Allergic rhinitis due to pollen: Secondary | ICD-10-CM | POA: Diagnosis not present

## 2021-08-16 DIAGNOSIS — J3089 Other allergic rhinitis: Secondary | ICD-10-CM | POA: Diagnosis not present

## 2021-08-21 DIAGNOSIS — J3089 Other allergic rhinitis: Secondary | ICD-10-CM | POA: Diagnosis not present

## 2021-08-21 DIAGNOSIS — J301 Allergic rhinitis due to pollen: Secondary | ICD-10-CM | POA: Diagnosis not present

## 2021-08-23 DIAGNOSIS — J301 Allergic rhinitis due to pollen: Secondary | ICD-10-CM | POA: Diagnosis not present

## 2021-08-23 DIAGNOSIS — J3089 Other allergic rhinitis: Secondary | ICD-10-CM | POA: Diagnosis not present

## 2021-08-28 DIAGNOSIS — J301 Allergic rhinitis due to pollen: Secondary | ICD-10-CM | POA: Diagnosis not present

## 2021-08-28 DIAGNOSIS — J3089 Other allergic rhinitis: Secondary | ICD-10-CM | POA: Diagnosis not present

## 2021-09-04 DIAGNOSIS — J3089 Other allergic rhinitis: Secondary | ICD-10-CM | POA: Diagnosis not present

## 2021-09-04 DIAGNOSIS — J301 Allergic rhinitis due to pollen: Secondary | ICD-10-CM | POA: Diagnosis not present

## 2021-09-11 DIAGNOSIS — J3089 Other allergic rhinitis: Secondary | ICD-10-CM | POA: Diagnosis not present

## 2021-09-11 DIAGNOSIS — J301 Allergic rhinitis due to pollen: Secondary | ICD-10-CM | POA: Diagnosis not present

## 2021-09-15 DIAGNOSIS — R0602 Shortness of breath: Secondary | ICD-10-CM | POA: Diagnosis not present

## 2021-09-15 DIAGNOSIS — J3089 Other allergic rhinitis: Secondary | ICD-10-CM | POA: Diagnosis not present

## 2021-09-15 DIAGNOSIS — H1045 Other chronic allergic conjunctivitis: Secondary | ICD-10-CM | POA: Diagnosis not present

## 2021-09-15 DIAGNOSIS — J301 Allergic rhinitis due to pollen: Secondary | ICD-10-CM | POA: Diagnosis not present

## 2021-09-18 DIAGNOSIS — J3089 Other allergic rhinitis: Secondary | ICD-10-CM | POA: Diagnosis not present

## 2021-09-18 DIAGNOSIS — J301 Allergic rhinitis due to pollen: Secondary | ICD-10-CM | POA: Diagnosis not present

## 2021-09-20 DIAGNOSIS — J301 Allergic rhinitis due to pollen: Secondary | ICD-10-CM | POA: Diagnosis not present

## 2021-09-20 DIAGNOSIS — J3089 Other allergic rhinitis: Secondary | ICD-10-CM | POA: Diagnosis not present

## 2021-09-25 DIAGNOSIS — J3089 Other allergic rhinitis: Secondary | ICD-10-CM | POA: Diagnosis not present

## 2021-09-25 DIAGNOSIS — J301 Allergic rhinitis due to pollen: Secondary | ICD-10-CM | POA: Diagnosis not present

## 2021-09-27 DIAGNOSIS — J3089 Other allergic rhinitis: Secondary | ICD-10-CM | POA: Diagnosis not present

## 2021-09-27 DIAGNOSIS — J301 Allergic rhinitis due to pollen: Secondary | ICD-10-CM | POA: Diagnosis not present

## 2021-10-04 DIAGNOSIS — J3089 Other allergic rhinitis: Secondary | ICD-10-CM | POA: Diagnosis not present

## 2021-10-04 DIAGNOSIS — J301 Allergic rhinitis due to pollen: Secondary | ICD-10-CM | POA: Diagnosis not present

## 2021-10-10 DIAGNOSIS — J3081 Allergic rhinitis due to animal (cat) (dog) hair and dander: Secondary | ICD-10-CM | POA: Diagnosis not present

## 2021-10-10 DIAGNOSIS — J301 Allergic rhinitis due to pollen: Secondary | ICD-10-CM | POA: Diagnosis not present

## 2021-10-10 DIAGNOSIS — J3089 Other allergic rhinitis: Secondary | ICD-10-CM | POA: Diagnosis not present

## 2021-10-16 DIAGNOSIS — J3089 Other allergic rhinitis: Secondary | ICD-10-CM | POA: Diagnosis not present

## 2021-10-16 DIAGNOSIS — J301 Allergic rhinitis due to pollen: Secondary | ICD-10-CM | POA: Diagnosis not present

## 2021-10-23 DIAGNOSIS — J3089 Other allergic rhinitis: Secondary | ICD-10-CM | POA: Diagnosis not present

## 2021-10-23 DIAGNOSIS — J301 Allergic rhinitis due to pollen: Secondary | ICD-10-CM | POA: Diagnosis not present

## 2021-11-07 DIAGNOSIS — J3089 Other allergic rhinitis: Secondary | ICD-10-CM | POA: Diagnosis not present

## 2021-11-08 ENCOUNTER — Ambulatory Visit: Payer: BC Managed Care – PPO | Admitting: Internal Medicine

## 2021-11-08 VITALS — BP 108/70 | HR 70 | Temp 98.7°F | Ht 62.0 in | Wt 105.0 lb

## 2021-11-08 DIAGNOSIS — R6889 Other general symptoms and signs: Secondary | ICD-10-CM | POA: Diagnosis not present

## 2021-11-08 LAB — POCT INFLUENZA A/B: Influenza A, POC: NEGATIVE

## 2021-11-08 LAB — POC COVID19 BINAXNOW: SARS Coronavirus 2 Ag: NEGATIVE

## 2021-11-08 MED ORDER — HYDROCODONE BIT-HOMATROP MBR 5-1.5 MG/5ML PO SOLN: 5.0000 mL | Freq: Three times a day (TID) | ORAL | 0 refills | Status: DC | PRN

## 2021-11-08 NOTE — Progress Notes (Signed)
Chief Complaint  Patient presents with   Flu-like Symptoms    HPI: Tracey Patrick 66 y.o. come in for almost 4 days of respiratory flulike symptoms. Traveled to [place] 3 days before the onset of symptoms with scratchy throat and body aches upper respiratory congestion and cough. Face pressure discomfort ears popping. No specific exposure otherwise spouse is not sick. No shortness of breath or localized pain progressing. ROS: See pertinent positives and negatives per HPI.  Past Medical History:  Diagnosis Date   CHEST PAIN, ATYPICAL 09/24/2008   Qualifier: Diagnosis of  By: Lawernce Ion, CMA (AAMA), Bethann Berkshire    COLONIC POLYPS, HX OF 10/04/2008   Qualifier: Diagnosis of  By: Fabian Sharp MD, Neta Mends    Desensitization to allergy shot    [name]  2016   Endometriosis    ENDOMETRIOSIS 09/24/2008   Qualifier: Diagnosis of  By: Lawernce Ion, CMA (AAMA), Bethann Berkshire    HEMORRHOIDS 09/24/2008   Qualifier: Diagnosis of  By: Lawernce Ion, CMA (AAMA), Bethann Berkshire    SCOLIOSIS 09/24/2008   Qualifier: Diagnosis of  By: Lawernce Ion, CMA (AAMA), Bethann Berkshire     Family History  Problem Relation Age of Onset   Breast cancer Mother        died mva age 22   Heart attack Mother 33   Hyperlipidemia Mother    Macular degeneration Father    CVA Father        felt secondary to Vioxx   Hyperlipidemia Father    Hyperlipidemia Brother        3 brothes    Alcohol abuse Paternal Grandfather     Social History   Socioeconomic History   Marital status: Married    Spouse name: Not on file   Number of children: Not on file   Years of education: Not on file   Highest education level: Professional school degree (e.g., MD, DDS, DVM, JD)  Occupational History   Not on file  Tobacco Use   Smoking status: Never   Smokeless tobacco: Never  Vaping Use   Vaping Use: Never used  Substance and Sexual Activity   Alcohol use: Yes    Alcohol/week: 6.0 standard drinks    Types: 6 Glasses of wine per week    Comment:  wine nightly   Drug use: No   Sexual activity: Not on file  Other Topics Concern   Not on file  Social History Narrative   hh of 2  Married    MD opthalmologist  Never tobacco    G1P0   Social Determinants of Health   Financial Resource Strain: Low Risk    Difficulty of Paying Living Expenses: Not hard at all  Food Insecurity: No Food Insecurity   Worried About Programme researcher, broadcasting/film/video in the Last Year: Never true   Ran Out of Food in the Last Year: Never true  Transportation Needs: No Transportation Needs   Lack of Transportation (Medical): No   Lack of Transportation (Non-Medical): No  Physical Activity: Insufficiently Active   Days of Exercise per Week: 3 days   Minutes of Exercise per Session: 40 min  Stress: No Stress Concern Present   Feeling of Stress : Not at all  Social Connections: Unknown   Frequency of Communication with Friends and Family: Patient refused   Frequency of Social Gatherings with Friends and Family: Patient refused   Attends Religious Services: Patient refused   Database administrator or Organizations: No   Attends Banker  Meetings: Not on file   Marital Status: Married    Outpatient Medications Prior to Visit  Medication Sig Dispense Refill   CALCIUM CARBONATE-VIT D-MIN PO Take by mouth. 500 MG OF CALCIUM     co-enzyme Q-10 50 MG capsule Take 50 mg by mouth daily.     cycloSPORINE (RESTASIS) 0.05 % ophthalmic emulsion Place 1 drop into both eyes 2 (two) times daily.     docusate sodium (COLACE) 100 MG capsule Take 100 mg by mouth daily.      Ferrous Fumarate (IRON) 18 MG TBCR Take by mouth.     Garlic 300 MG CAPS Take 1 capsule by mouth daily.     Lutein 20 MG CAPS Take 20 mg by mouth daily.     Multiple Vitamin (MULTIVITAMIN) tablet Take 1 tablet by mouth daily.     Omega-3 Fatty Acids (FISH OIL PO) Take 1 g by mouth daily.     OVER THE COUNTER MEDICATION ZEAXANTHIN - 2 MG DAILY     PRESCRIPTION MEDICATION ALLERGY SHOTS     Psyllium  (METAMUCIL FIBER PO) Take 20 g by mouth daily.     rosuvastatin (CRESTOR) 10 MG tablet Take 1 tablet (10 mg total) by mouth at bedtime. 90 tablet 3   No facility-administered medications prior to visit.     EXAM:  BP 108/70 (BP Location: Left Arm, Patient Position: Sitting, Cuff Size: Normal)    Pulse 70    Temp 98.7 F (37.1 C) (Oral)    Ht 5\' 2"  (1.575 m)    Wt 105 lb (47.6 kg)    SpO2 98%    BMI 19.20 kg/m   Body mass index is 19.2 kg/m.  GENERAL: vitals reviewed and listed above, alert, oriented, appears well hydrated and in no acute distress congested nontoxic mildly ill HEENT: atraumatic, conjunctiva  clear, no obvious abnormalities on inspection of external nose and ears right TM normal left TM slightly retracted clear fluid OP : Masked not examined face mildly tender NECK: no obvious masses on inspection palpation  LUNGS: clear to auscultation bilaterally, no wheezes, rales or rhonchi, good air movement CV: HRRR, no clubbing cyanosis or obvious peripheral edema nl cap refill  MS: moves all extremities without noticeable focal  abnormality PSYCH: pleasant and cooperative, no obvious depression or anxiety  BP Readings from Last 3 Encounters:  11/08/21 108/70  03/24/21 110/72  04/08/20 102/63   Rapid influenza and rapid COVID-19 test negative ASSESSMENT AND PLAN:  Discussed the following assessment and plan:  Flu-like symptoms - Plan: POC COVID-19, POC Influenza A/B Viral respiratory illness.  Currently uncomplicated Symptomatic treatment can try Afrin at night warm liquids rest cough medicine discussed if helpful Contact 06/09/20 if persistent progressive localized pain or alarm symptoms.  She could consider retesting for COVID in 2 days or get a PCR test.  If needed. -Patient advised to return or notify health care team  if  new concerns arise.  Patient Instructions  Agree this is viral resp infection. Chest is clear  Cough med as indicated .   Left  [place] know if    localized pain  sinus on going  after weekend  Afrin at night ok .   [place]. Kyleigha Markert M.D.

## 2021-11-08 NOTE — Patient Instructions (Signed)
Agree this is viral resp infection. Chest is clear  Cough med as indicated .   Left  [place] know if   localized pain  sinus on going  after weekend  Afrin at night ok .

## 2021-11-10 DIAGNOSIS — J3089 Other allergic rhinitis: Secondary | ICD-10-CM | POA: Diagnosis not present

## 2021-11-10 DIAGNOSIS — J301 Allergic rhinitis due to pollen: Secondary | ICD-10-CM | POA: Diagnosis not present

## 2021-11-13 DIAGNOSIS — J301 Allergic rhinitis due to pollen: Secondary | ICD-10-CM | POA: Diagnosis not present

## 2021-11-13 DIAGNOSIS — J3089 Other allergic rhinitis: Secondary | ICD-10-CM | POA: Diagnosis not present

## 2021-11-20 DIAGNOSIS — J3089 Other allergic rhinitis: Secondary | ICD-10-CM | POA: Diagnosis not present

## 2021-11-20 DIAGNOSIS — J301 Allergic rhinitis due to pollen: Secondary | ICD-10-CM | POA: Diagnosis not present

## 2021-11-27 DIAGNOSIS — J3089 Other allergic rhinitis: Secondary | ICD-10-CM | POA: Diagnosis not present

## 2021-11-27 DIAGNOSIS — J301 Allergic rhinitis due to pollen: Secondary | ICD-10-CM | POA: Diagnosis not present

## 2021-11-27 DIAGNOSIS — J3081 Allergic rhinitis due to animal (cat) (dog) hair and dander: Secondary | ICD-10-CM | POA: Diagnosis not present

## 2021-12-04 DIAGNOSIS — J301 Allergic rhinitis due to pollen: Secondary | ICD-10-CM | POA: Diagnosis not present

## 2021-12-04 DIAGNOSIS — J3089 Other allergic rhinitis: Secondary | ICD-10-CM | POA: Diagnosis not present

## 2021-12-07 ENCOUNTER — Other Ambulatory Visit: Payer: Self-pay | Admitting: *Deleted

## 2021-12-07 DIAGNOSIS — Z01812 Encounter for preprocedural laboratory examination: Secondary | ICD-10-CM

## 2021-12-07 DIAGNOSIS — I7122 Aneurysm of the aortic arch, without rupture: Secondary | ICD-10-CM

## 2021-12-11 DIAGNOSIS — J3089 Other allergic rhinitis: Secondary | ICD-10-CM | POA: Diagnosis not present

## 2021-12-11 DIAGNOSIS — J301 Allergic rhinitis due to pollen: Secondary | ICD-10-CM | POA: Diagnosis not present

## 2021-12-18 DIAGNOSIS — J3089 Other allergic rhinitis: Secondary | ICD-10-CM | POA: Diagnosis not present

## 2021-12-18 DIAGNOSIS — J301 Allergic rhinitis due to pollen: Secondary | ICD-10-CM | POA: Diagnosis not present

## 2021-12-27 DIAGNOSIS — J3089 Other allergic rhinitis: Secondary | ICD-10-CM | POA: Diagnosis not present

## 2021-12-27 DIAGNOSIS — J301 Allergic rhinitis due to pollen: Secondary | ICD-10-CM | POA: Diagnosis not present

## 2021-12-28 DIAGNOSIS — J3089 Other allergic rhinitis: Secondary | ICD-10-CM | POA: Diagnosis not present

## 2022-01-01 DIAGNOSIS — J301 Allergic rhinitis due to pollen: Secondary | ICD-10-CM | POA: Diagnosis not present

## 2022-01-01 DIAGNOSIS — J3089 Other allergic rhinitis: Secondary | ICD-10-CM | POA: Diagnosis not present

## 2022-01-03 DIAGNOSIS — J3089 Other allergic rhinitis: Secondary | ICD-10-CM | POA: Diagnosis not present

## 2022-01-03 DIAGNOSIS — J3081 Allergic rhinitis due to animal (cat) (dog) hair and dander: Secondary | ICD-10-CM | POA: Diagnosis not present

## 2022-01-03 DIAGNOSIS — J301 Allergic rhinitis due to pollen: Secondary | ICD-10-CM | POA: Diagnosis not present

## 2022-01-05 LAB — BASIC METABOLIC PANEL
BUN: 8 (ref 4–21)
Chloride: 94 — AB (ref 99–108)
Creatinine: 0.8 (ref 0.5–1.1)
Glucose: 88
Potassium: 4.9 mEq/L (ref 3.5–5.1)
Sodium: 135 — AB (ref 137–147)

## 2022-01-05 LAB — LIPID PANEL
Cholesterol: 154 (ref 0–200)
HDL: 80 — AB (ref 35–70)
LDL Cholesterol: 62
Triglycerides: 56 (ref 40–160)

## 2022-01-05 LAB — CBC AND DIFFERENTIAL
HCT: 37 (ref 36–46)
Hemoglobin: 12.5 (ref 12.0–16.0)
Neutrophils Absolute: 2.9
Neutrophils Absolute: 54
Platelets: 390 10*3/uL (ref 150–400)
WBC: 5.4

## 2022-01-05 LAB — COMPREHENSIVE METABOLIC PANEL
Albumin: 4.4 (ref 3.5–5.0)
Calcium: 10 (ref 8.7–10.7)
Globulin: 2.3
eGFR: 89

## 2022-01-05 LAB — HEPATIC FUNCTION PANEL
ALT: 21 U/L (ref 7–35)
AST: 25 (ref 13–35)
Alkaline Phosphatase: 45 (ref 25–125)

## 2022-01-05 LAB — TSH: TSH: 1.13 (ref 0.41–5.90)

## 2022-01-05 LAB — CBC: RBC: 4 (ref 3.87–5.11)

## 2022-01-08 DIAGNOSIS — J301 Allergic rhinitis due to pollen: Secondary | ICD-10-CM | POA: Diagnosis not present

## 2022-01-08 DIAGNOSIS — J3089 Other allergic rhinitis: Secondary | ICD-10-CM | POA: Diagnosis not present

## 2022-01-15 DIAGNOSIS — J301 Allergic rhinitis due to pollen: Secondary | ICD-10-CM | POA: Diagnosis not present

## 2022-01-15 DIAGNOSIS — J3089 Other allergic rhinitis: Secondary | ICD-10-CM | POA: Diagnosis not present

## 2022-01-22 DIAGNOSIS — J301 Allergic rhinitis due to pollen: Secondary | ICD-10-CM | POA: Diagnosis not present

## 2022-01-22 DIAGNOSIS — J3089 Other allergic rhinitis: Secondary | ICD-10-CM | POA: Diagnosis not present

## 2022-01-29 DIAGNOSIS — J301 Allergic rhinitis due to pollen: Secondary | ICD-10-CM | POA: Diagnosis not present

## 2022-01-29 DIAGNOSIS — J3089 Other allergic rhinitis: Secondary | ICD-10-CM | POA: Diagnosis not present

## 2022-01-29 DIAGNOSIS — J3081 Allergic rhinitis due to animal (cat) (dog) hair and dander: Secondary | ICD-10-CM | POA: Diagnosis not present

## 2022-02-05 DIAGNOSIS — J3089 Other allergic rhinitis: Secondary | ICD-10-CM | POA: Diagnosis not present

## 2022-02-05 DIAGNOSIS — J301 Allergic rhinitis due to pollen: Secondary | ICD-10-CM | POA: Diagnosis not present

## 2022-02-05 DIAGNOSIS — J3081 Allergic rhinitis due to animal (cat) (dog) hair and dander: Secondary | ICD-10-CM | POA: Diagnosis not present

## 2022-02-07 DIAGNOSIS — J301 Allergic rhinitis due to pollen: Secondary | ICD-10-CM | POA: Diagnosis not present

## 2022-02-12 DIAGNOSIS — J301 Allergic rhinitis due to pollen: Secondary | ICD-10-CM | POA: Diagnosis not present

## 2022-02-12 DIAGNOSIS — J3089 Other allergic rhinitis: Secondary | ICD-10-CM | POA: Diagnosis not present

## 2022-02-19 DIAGNOSIS — J301 Allergic rhinitis due to pollen: Secondary | ICD-10-CM | POA: Diagnosis not present

## 2022-02-19 DIAGNOSIS — J3089 Other allergic rhinitis: Secondary | ICD-10-CM | POA: Diagnosis not present

## 2022-02-27 DIAGNOSIS — J3081 Allergic rhinitis due to animal (cat) (dog) hair and dander: Secondary | ICD-10-CM | POA: Diagnosis not present

## 2022-02-27 DIAGNOSIS — J3089 Other allergic rhinitis: Secondary | ICD-10-CM | POA: Diagnosis not present

## 2022-02-27 DIAGNOSIS — J301 Allergic rhinitis due to pollen: Secondary | ICD-10-CM | POA: Diagnosis not present

## 2022-03-07 DIAGNOSIS — J3089 Other allergic rhinitis: Secondary | ICD-10-CM | POA: Diagnosis not present

## 2022-03-07 DIAGNOSIS — J301 Allergic rhinitis due to pollen: Secondary | ICD-10-CM | POA: Diagnosis not present

## 2022-03-07 DIAGNOSIS — J3081 Allergic rhinitis due to animal (cat) (dog) hair and dander: Secondary | ICD-10-CM | POA: Diagnosis not present

## 2022-03-12 DIAGNOSIS — J3089 Other allergic rhinitis: Secondary | ICD-10-CM | POA: Diagnosis not present

## 2022-03-12 DIAGNOSIS — J3081 Allergic rhinitis due to animal (cat) (dog) hair and dander: Secondary | ICD-10-CM | POA: Diagnosis not present

## 2022-03-12 DIAGNOSIS — J301 Allergic rhinitis due to pollen: Secondary | ICD-10-CM | POA: Diagnosis not present

## 2022-03-16 ENCOUNTER — Other Ambulatory Visit: Payer: Self-pay | Admitting: Internal Medicine

## 2022-03-16 ENCOUNTER — Ambulatory Visit (INDEPENDENT_AMBULATORY_CARE_PROVIDER_SITE_OTHER)
Admission: RE | Admit: 2022-03-16 | Discharge: 2022-03-16 | Disposition: A | Discharge status: Home / Self Care | Payer: BC Managed Care – PPO | Source: Ambulatory Visit | Attending: Cardiology | Admitting: Cardiology

## 2022-03-16 DIAGNOSIS — I7789 Other specified disorders of arteries and arterioles: Secondary | ICD-10-CM

## 2022-03-16 DIAGNOSIS — J9811 Atelectasis: Secondary | ICD-10-CM | POA: Diagnosis not present

## 2022-03-16 MED ORDER — IOHEXOL 350 MG/ML SOLN
80.0000 mL | Freq: Once | INTRAVENOUS | Status: AC | PRN
  Administered 2022-03-16: 80 mL via INTRAVENOUS

## 2022-03-16 NOTE — Telephone Encounter (Signed)
Last Ov 11/08/21 Filled 08/16/20 Is it ok to refill?

## 2022-03-19 DIAGNOSIS — J3089 Other allergic rhinitis: Secondary | ICD-10-CM | POA: Diagnosis not present

## 2022-03-19 DIAGNOSIS — J301 Allergic rhinitis due to pollen: Secondary | ICD-10-CM | POA: Diagnosis not present

## 2022-03-20 NOTE — Telephone Encounter (Signed)
Please arrange for future fasting lipid panel  dx HLD  and  refill rosuvastatin 90 days  refill x 3

## 2022-03-26 DIAGNOSIS — J301 Allergic rhinitis due to pollen: Secondary | ICD-10-CM | POA: Diagnosis not present

## 2022-03-26 DIAGNOSIS — J3089 Other allergic rhinitis: Secondary | ICD-10-CM | POA: Diagnosis not present

## 2022-03-26 DIAGNOSIS — J3081 Allergic rhinitis due to animal (cat) (dog) hair and dander: Secondary | ICD-10-CM | POA: Diagnosis not present

## 2022-04-02 DIAGNOSIS — J3089 Other allergic rhinitis: Secondary | ICD-10-CM | POA: Diagnosis not present

## 2022-04-02 DIAGNOSIS — J301 Allergic rhinitis due to pollen: Secondary | ICD-10-CM | POA: Diagnosis not present

## 2022-04-02 DIAGNOSIS — J3081 Allergic rhinitis due to animal (cat) (dog) hair and dander: Secondary | ICD-10-CM | POA: Diagnosis not present

## 2022-04-05 ENCOUNTER — Encounter: Payer: Self-pay | Admitting: Internal Medicine

## 2022-04-05 DIAGNOSIS — E785 Hyperlipidemia, unspecified: Secondary | ICD-10-CM | POA: Insufficient documentation

## 2022-04-05 NOTE — Progress Notes (Signed)
Cardiology Office Note   Date:  04/06/2022   ID:  Tracey Patrick, DOB 1956/03/14, MRN [id number]  PCP:  Madelin Headings, MD  Cardiologist:   None Referring:  Madelin Headings, MD  Chief Complaint  Patient presents with   Palpitations       History of Present Illness: Tracey Patrick is a 66 y.o. female who is referred by Madelin Headings, MD for evaluation of cardiovascular risk factors and an elevated coronary calcium score of 4 which was 64 percentile for her age and gender.  She had a negative POET (Plain Old Exercise Treadmill) last year.  She had an enlarged aorta at 42  mm.    However, I repeated this study last month and there was no evidence of aortic enlargement.    Since I last saw her she has done well. The patient denies any new symptoms such as chest discomfort, neck or arm discomfort. There has been no new shortness of breath, PND or orthopnea. There have been no reported palpitations, presyncope or syncope.  She remains very active.  She has had no cardiovascular symptoms.   Past Medical History:  Diagnosis Date   CHEST PAIN, ATYPICAL 09/24/2008   Qualifier: Diagnosis of  By: Lawernce Ion, CMA (AAMA), Shannon S    COLONIC POLYPS, HX OF 10/04/2008   Qualifier: Diagnosis of  By: Fabian Sharp MD, Neta Mends    Desensitization to allergy shot    [name]  2016   Endometriosis    ENDOMETRIOSIS 09/24/2008   Qualifier: Diagnosis of  By: Lawernce Ion, CMA (AAMA), Bethann Berkshire    HEMORRHOIDS 09/24/2008   Qualifier: Diagnosis of  By: Lawernce Ion, CMA (AAMA), Bethann Berkshire    SCOLIOSIS 09/24/2008   Qualifier: Diagnosis of  By: Lawernce Ion, CMA (AAMA), Bethann Berkshire     Past Surgical History:  Procedure Laterality Date   ENDOMETRIAL ABLATION  2004   LIPOSUCTION  2019   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 500 MG OF CALCIUM     co-enzyme Q-10 50 MG capsule Take 50 mg by  mouth daily.     cycloSPORINE (RESTASIS) 0.05 % ophthalmic emulsion Place 1 drop into both eyes 2 (two) times daily.     docusate sodium (COLACE) 100 MG capsule Take 100 mg by mouth daily.      Ferrous Fumarate (IRON) 18 MG TBCR Take by mouth 2 (two) times a week.     HYDROcodone bit-homatropine (HYCODAN) 5-1.5 MG/5ML syrup Take 5 mLs by mouth every 8 (eight) hours as needed for cough. 120 mL 0   Lutein 20 MG CAPS Take 20 mg by mouth daily.     Multiple Vitamin (MULTIVITAMIN) tablet Take 1 tablet by mouth daily.     Omega-3 Fatty Acids (FISH OIL PO) Take 1 g by mouth daily.     OVER THE COUNTER MEDICATION ZEAXANTHIN - 2 MG DAILY     PRESCRIPTION MEDICATION ALLERGY SHOTS     Psyllium (METAMUCIL FIBER PO) Take 20 g by mouth daily.     rosuvastatin (CRESTOR) 10 MG tablet TAKE 1 TABLET ONCE DAILY. (Patient taking differently: 3 (three) times a week.) 90 tablet 3   No current facility-administered medications for this visit.    Allergies:   Patient has no known allergies.    ROS:  Please see the history of present illness.  Otherwise, review of systems are positive for none.   All other systems are reviewed and negative.    PHYSICAL EXAM: VS:  BP 100/76   Pulse (!) 55   Ht 5\' 2"  (1.575 m)   Wt 106 lb (48.1 kg)   SpO2 96%   BMI 19.39 kg/m  , BMI Body mass index is 19.39 kg/m. GENERAL:  Well appearing NECK:  No jugular venous distention, waveform within normal limits, carotid upstroke brisk and symmetric, no bruits, no thyromegaly LUNGS:  Clear to auscultation bilaterally CHEST:  Unremarkable HEART:  PMI not displaced or sustained,S1 and S2 within normal limits, no S3, no S4, no clicks, no rubs, no murmurs ABD:  Flat, positive bowel sounds normal in frequency in pitch, no bruits, no rebound, no guarding, no midline pulsatile mass, no hepatomegaly, no splenomegaly EXT:  2 plus pulses throughout, no edema, no cyanosis no clubbing   EKG:  EKG is  ordered today. The ekg ordered today  demonstrates sinus rhythm, rate 55, axis within normal limits, intervals within normal limits, no acute ST-T wave changes.   Recent Labs: 01/05/2022: ALT 21; BUN 8; Creatinine 0.8; Hemoglobin 12.5; Platelets 390; Potassium 4.9; Sodium 135; TSH 1.13    Lipid Panel    Component Value Date/Time   CHOL 154 01/05/2022 0000   TRIG 56 01/05/2022 0000   HDL 80 (A) 01/05/2022 0000   CHOLHDL 2.3 CALC 09/24/2008 0857   VLDL 6 09/24/2008 0857   LDLCALC 62 01/05/2022 0000   LDLDIRECT 109.2 09/24/2008 0857      Wt Readings from Last 3 Encounters:  04/06/22 106 lb (48.1 kg)  11/08/21 105 lb (47.6 kg)  03/24/21 108 lb 9.6 oz (49.3 kg)      Other studies Reviewed: Additional studies/ records that were reviewed today include: Labs Review of the above records demonstrates:  Please see elsewhere in the note.     ASSESSMENT AND PLAN:   ELEVATED CORONARY CALCIUM SCORE: She has no new symptoms.  We talked about risk reduction.  We talked at exercise and diet.  No change in therapy.  DYSLIPIDEMIA:   She has an excellent lipid profile with an LDL of 89 and HDL of 108 on Crestor 3 times a week.  No change in therapy.  I suggested that she get an LP(a) measured at some point.   PALPITATIONS: She is no longer bothered by these.  No further work-up.   Current medicines are reviewed at length with the patient today.  The patient does not have concerns regarding medicines.  The following changes have been made: None  Labs/ tests ordered today include:   None  Orders Placed This Encounter  Procedures   EKG 12-Lead      Disposition:   FU with me in 2 years  Signed, 03/26/21, MD  04/06/2022 8:09 AM    [facility]

## 2022-04-06 ENCOUNTER — Ambulatory Visit: Payer: BC Managed Care – PPO | Admitting: Cardiology

## 2022-04-06 ENCOUNTER — Encounter: Payer: Self-pay | Admitting: Cardiology

## 2022-04-06 VITALS — BP 100/76 | HR 55 | Ht 62.0 in | Wt 106.0 lb

## 2022-04-06 DIAGNOSIS — E785 Hyperlipidemia, unspecified: Secondary | ICD-10-CM

## 2022-04-06 DIAGNOSIS — R931 Abnormal findings on diagnostic imaging of heart and coronary circulation: Secondary | ICD-10-CM | POA: Diagnosis not present

## 2022-04-06 NOTE — Patient Instructions (Signed)
Medication Instructions:  Your physician recommends that you continue on your current medications as directed. Please refer to the Current Medication list given to you today.  *If you need a refill on your cardiac medications before your next appointment, please call your pharmacy*   Lab Work: Your physician recommends that you return for lab work in:  Next time you have labs drawn please have them draw: LP(a) If you have labs (blood work) drawn today and your tests are completely normal, you will receive your results only by: MyChart Message (if you have MyChart) OR A paper copy in the mail If you have any lab test that is abnormal or we need to change your treatment, we will call you to review the results.   Testing/Procedures: None   Follow-Up: At [facility], you and your health needs are our priority.  As part of our continuing mission to provide you with exceptional heart care, we have created designated Provider Care Teams.  These Care Teams include your primary Cardiologist (physician) and Advanced Practice Providers (APPs -  Physician Assistants and Nurse Practitioners) who all work together to provide you with the care you need, when you need it.  We recommend signing up for the patient portal called "MyChart".  Sign up information is provided on this After Visit Summary.  MyChart is used to connect with patients for Virtual Visits (Telemedicine).  Patients are able to view lab/test results, encounter notes, upcoming appointments, etc.  Non-urgent messages can be sent to your provider as well.   To learn more about what you can do with MyChart, go to [email or web address].    Your next appointment:   2 year(s)  The format for your next appointment:   In Person  Provider:   Rollene Rotunda, MD    Other Instructions   Important Information About Sugar

## 2022-04-12 DIAGNOSIS — J3089 Other allergic rhinitis: Secondary | ICD-10-CM | POA: Diagnosis not present

## 2022-04-12 DIAGNOSIS — J3081 Allergic rhinitis due to animal (cat) (dog) hair and dander: Secondary | ICD-10-CM | POA: Diagnosis not present

## 2022-04-12 DIAGNOSIS — J301 Allergic rhinitis due to pollen: Secondary | ICD-10-CM | POA: Diagnosis not present

## 2022-04-16 DIAGNOSIS — J301 Allergic rhinitis due to pollen: Secondary | ICD-10-CM | POA: Diagnosis not present

## 2022-04-16 DIAGNOSIS — J3089 Other allergic rhinitis: Secondary | ICD-10-CM | POA: Diagnosis not present

## 2022-04-16 DIAGNOSIS — J3081 Allergic rhinitis due to animal (cat) (dog) hair and dander: Secondary | ICD-10-CM | POA: Diagnosis not present

## 2022-04-23 DIAGNOSIS — J301 Allergic rhinitis due to pollen: Secondary | ICD-10-CM | POA: Diagnosis not present

## 2022-04-23 DIAGNOSIS — J3089 Other allergic rhinitis: Secondary | ICD-10-CM | POA: Diagnosis not present

## 2022-04-23 DIAGNOSIS — J3081 Allergic rhinitis due to animal (cat) (dog) hair and dander: Secondary | ICD-10-CM | POA: Diagnosis not present

## 2022-04-30 DIAGNOSIS — J3081 Allergic rhinitis due to animal (cat) (dog) hair and dander: Secondary | ICD-10-CM | POA: Diagnosis not present

## 2022-04-30 DIAGNOSIS — J301 Allergic rhinitis due to pollen: Secondary | ICD-10-CM | POA: Diagnosis not present

## 2022-04-30 DIAGNOSIS — J3089 Other allergic rhinitis: Secondary | ICD-10-CM | POA: Diagnosis not present

## 2022-05-07 DIAGNOSIS — J301 Allergic rhinitis due to pollen: Secondary | ICD-10-CM | POA: Diagnosis not present

## 2022-05-07 DIAGNOSIS — J3081 Allergic rhinitis due to animal (cat) (dog) hair and dander: Secondary | ICD-10-CM | POA: Diagnosis not present

## 2022-05-07 DIAGNOSIS — J3089 Other allergic rhinitis: Secondary | ICD-10-CM | POA: Diagnosis not present

## 2022-05-14 DIAGNOSIS — J301 Allergic rhinitis due to pollen: Secondary | ICD-10-CM | POA: Diagnosis not present

## 2022-05-14 DIAGNOSIS — J3089 Other allergic rhinitis: Secondary | ICD-10-CM | POA: Diagnosis not present

## 2022-05-16 ENCOUNTER — Telehealth: Payer: Self-pay | Admitting: Internal Medicine

## 2022-05-16 DIAGNOSIS — M4125 Other idiopathic scoliosis, thoracolumbar region: Secondary | ICD-10-CM | POA: Diagnosis not present

## 2022-05-16 DIAGNOSIS — M7551 Bursitis of right shoulder: Secondary | ICD-10-CM | POA: Diagnosis not present

## 2022-05-16 DIAGNOSIS — M419 Scoliosis, unspecified: Secondary | ICD-10-CM | POA: Diagnosis not present

## 2022-05-16 NOTE — Telephone Encounter (Signed)
Needs records pertaining to spine, scoliosis faxed to [phone]. Needs these records prior to them performing certain procedures

## 2022-05-16 NOTE — Telephone Encounter (Signed)
Spoke with Misty Stanley and nothing further is needed at this time.

## 2022-05-21 DIAGNOSIS — J301 Allergic rhinitis due to pollen: Secondary | ICD-10-CM | POA: Diagnosis not present

## 2022-05-21 DIAGNOSIS — J3081 Allergic rhinitis due to animal (cat) (dog) hair and dander: Secondary | ICD-10-CM | POA: Diagnosis not present

## 2022-05-21 DIAGNOSIS — J3089 Other allergic rhinitis: Secondary | ICD-10-CM | POA: Diagnosis not present

## 2022-05-25 DIAGNOSIS — M25511 Pain in right shoulder: Secondary | ICD-10-CM | POA: Diagnosis not present

## 2022-05-25 DIAGNOSIS — M7551 Bursitis of right shoulder: Secondary | ICD-10-CM | POA: Diagnosis not present

## 2022-05-28 DIAGNOSIS — J3081 Allergic rhinitis due to animal (cat) (dog) hair and dander: Secondary | ICD-10-CM | POA: Diagnosis not present

## 2022-05-28 DIAGNOSIS — J301 Allergic rhinitis due to pollen: Secondary | ICD-10-CM | POA: Diagnosis not present

## 2022-05-28 DIAGNOSIS — J3089 Other allergic rhinitis: Secondary | ICD-10-CM | POA: Diagnosis not present

## 2022-06-04 DIAGNOSIS — J301 Allergic rhinitis due to pollen: Secondary | ICD-10-CM | POA: Diagnosis not present

## 2022-06-04 DIAGNOSIS — J3089 Other allergic rhinitis: Secondary | ICD-10-CM | POA: Diagnosis not present

## 2022-06-13 DIAGNOSIS — J301 Allergic rhinitis due to pollen: Secondary | ICD-10-CM | POA: Diagnosis not present

## 2022-06-13 DIAGNOSIS — J3089 Other allergic rhinitis: Secondary | ICD-10-CM | POA: Diagnosis not present

## 2022-06-15 DIAGNOSIS — J3081 Allergic rhinitis due to animal (cat) (dog) hair and dander: Secondary | ICD-10-CM | POA: Diagnosis not present

## 2022-06-15 DIAGNOSIS — J3089 Other allergic rhinitis: Secondary | ICD-10-CM | POA: Diagnosis not present

## 2022-06-15 DIAGNOSIS — J301 Allergic rhinitis due to pollen: Secondary | ICD-10-CM | POA: Diagnosis not present

## 2022-06-22 DIAGNOSIS — J301 Allergic rhinitis due to pollen: Secondary | ICD-10-CM | POA: Diagnosis not present

## 2022-06-22 DIAGNOSIS — J3089 Other allergic rhinitis: Secondary | ICD-10-CM | POA: Diagnosis not present

## 2022-06-27 DIAGNOSIS — J301 Allergic rhinitis due to pollen: Secondary | ICD-10-CM | POA: Diagnosis not present

## 2022-06-27 DIAGNOSIS — J3089 Other allergic rhinitis: Secondary | ICD-10-CM | POA: Diagnosis not present

## 2022-07-02 DIAGNOSIS — J3089 Other allergic rhinitis: Secondary | ICD-10-CM | POA: Diagnosis not present

## 2022-07-02 DIAGNOSIS — J301 Allergic rhinitis due to pollen: Secondary | ICD-10-CM | POA: Diagnosis not present

## 2022-07-02 DIAGNOSIS — J3081 Allergic rhinitis due to animal (cat) (dog) hair and dander: Secondary | ICD-10-CM | POA: Diagnosis not present

## 2022-07-09 DIAGNOSIS — J3089 Other allergic rhinitis: Secondary | ICD-10-CM | POA: Diagnosis not present

## 2022-07-09 DIAGNOSIS — J301 Allergic rhinitis due to pollen: Secondary | ICD-10-CM | POA: Diagnosis not present

## 2022-07-09 DIAGNOSIS — J3081 Allergic rhinitis due to animal (cat) (dog) hair and dander: Secondary | ICD-10-CM | POA: Diagnosis not present

## 2022-07-17 DIAGNOSIS — J3081 Allergic rhinitis due to animal (cat) (dog) hair and dander: Secondary | ICD-10-CM | POA: Diagnosis not present

## 2022-07-17 DIAGNOSIS — J3089 Other allergic rhinitis: Secondary | ICD-10-CM | POA: Diagnosis not present

## 2022-07-17 DIAGNOSIS — J301 Allergic rhinitis due to pollen: Secondary | ICD-10-CM | POA: Diagnosis not present

## 2022-07-18 DIAGNOSIS — M7551 Bursitis of right shoulder: Secondary | ICD-10-CM | POA: Diagnosis not present

## 2022-07-23 DIAGNOSIS — J3081 Allergic rhinitis due to animal (cat) (dog) hair and dander: Secondary | ICD-10-CM | POA: Diagnosis not present

## 2022-07-23 DIAGNOSIS — J3089 Other allergic rhinitis: Secondary | ICD-10-CM | POA: Diagnosis not present

## 2022-07-23 DIAGNOSIS — J301 Allergic rhinitis due to pollen: Secondary | ICD-10-CM | POA: Diagnosis not present

## 2022-07-24 ENCOUNTER — Encounter: Payer: Self-pay | Admitting: Internal Medicine

## 2022-07-30 DIAGNOSIS — J3089 Other allergic rhinitis: Secondary | ICD-10-CM | POA: Diagnosis not present

## 2022-07-30 DIAGNOSIS — J301 Allergic rhinitis due to pollen: Secondary | ICD-10-CM | POA: Diagnosis not present

## 2022-07-30 DIAGNOSIS — J3081 Allergic rhinitis due to animal (cat) (dog) hair and dander: Secondary | ICD-10-CM | POA: Diagnosis not present

## 2022-08-06 DIAGNOSIS — J301 Allergic rhinitis due to pollen: Secondary | ICD-10-CM | POA: Diagnosis not present

## 2022-08-06 DIAGNOSIS — J3081 Allergic rhinitis due to animal (cat) (dog) hair and dander: Secondary | ICD-10-CM | POA: Diagnosis not present

## 2022-08-06 DIAGNOSIS — J3089 Other allergic rhinitis: Secondary | ICD-10-CM | POA: Diagnosis not present

## 2022-08-13 DIAGNOSIS — J3089 Other allergic rhinitis: Secondary | ICD-10-CM | POA: Diagnosis not present

## 2022-08-13 DIAGNOSIS — J3081 Allergic rhinitis due to animal (cat) (dog) hair and dander: Secondary | ICD-10-CM | POA: Diagnosis not present

## 2022-08-13 DIAGNOSIS — J301 Allergic rhinitis due to pollen: Secondary | ICD-10-CM | POA: Diagnosis not present

## 2022-08-20 DIAGNOSIS — J301 Allergic rhinitis due to pollen: Secondary | ICD-10-CM | POA: Diagnosis not present

## 2022-08-20 DIAGNOSIS — J3089 Other allergic rhinitis: Secondary | ICD-10-CM | POA: Diagnosis not present

## 2022-08-20 DIAGNOSIS — J3081 Allergic rhinitis due to animal (cat) (dog) hair and dander: Secondary | ICD-10-CM | POA: Diagnosis not present

## 2022-08-27 DIAGNOSIS — J3081 Allergic rhinitis due to animal (cat) (dog) hair and dander: Secondary | ICD-10-CM | POA: Diagnosis not present

## 2022-08-27 DIAGNOSIS — J3089 Other allergic rhinitis: Secondary | ICD-10-CM | POA: Diagnosis not present

## 2022-08-27 DIAGNOSIS — J301 Allergic rhinitis due to pollen: Secondary | ICD-10-CM | POA: Diagnosis not present

## 2022-09-03 DIAGNOSIS — J301 Allergic rhinitis due to pollen: Secondary | ICD-10-CM | POA: Diagnosis not present

## 2022-09-03 DIAGNOSIS — J3089 Other allergic rhinitis: Secondary | ICD-10-CM | POA: Diagnosis not present

## 2022-09-03 DIAGNOSIS — J3081 Allergic rhinitis due to animal (cat) (dog) hair and dander: Secondary | ICD-10-CM | POA: Diagnosis not present

## 2022-09-10 DIAGNOSIS — J301 Allergic rhinitis due to pollen: Secondary | ICD-10-CM | POA: Diagnosis not present

## 2022-09-10 DIAGNOSIS — J3081 Allergic rhinitis due to animal (cat) (dog) hair and dander: Secondary | ICD-10-CM | POA: Diagnosis not present

## 2022-09-10 DIAGNOSIS — J3089 Other allergic rhinitis: Secondary | ICD-10-CM | POA: Diagnosis not present

## 2022-09-14 DIAGNOSIS — R0602 Shortness of breath: Secondary | ICD-10-CM | POA: Diagnosis not present

## 2022-09-14 DIAGNOSIS — J3089 Other allergic rhinitis: Secondary | ICD-10-CM | POA: Diagnosis not present

## 2022-09-14 DIAGNOSIS — J301 Allergic rhinitis due to pollen: Secondary | ICD-10-CM | POA: Diagnosis not present

## 2022-09-14 DIAGNOSIS — H1045 Other chronic allergic conjunctivitis: Secondary | ICD-10-CM | POA: Diagnosis not present

## 2022-09-17 DIAGNOSIS — J301 Allergic rhinitis due to pollen: Secondary | ICD-10-CM | POA: Diagnosis not present

## 2022-09-17 DIAGNOSIS — J3089 Other allergic rhinitis: Secondary | ICD-10-CM | POA: Diagnosis not present

## 2022-09-17 DIAGNOSIS — J3081 Allergic rhinitis due to animal (cat) (dog) hair and dander: Secondary | ICD-10-CM | POA: Diagnosis not present

## 2022-09-20 DIAGNOSIS — J301 Allergic rhinitis due to pollen: Secondary | ICD-10-CM | POA: Diagnosis not present

## 2022-09-21 DIAGNOSIS — J3089 Other allergic rhinitis: Secondary | ICD-10-CM | POA: Diagnosis not present

## 2022-09-24 DIAGNOSIS — J3089 Other allergic rhinitis: Secondary | ICD-10-CM | POA: Diagnosis not present

## 2022-09-24 DIAGNOSIS — J3081 Allergic rhinitis due to animal (cat) (dog) hair and dander: Secondary | ICD-10-CM | POA: Diagnosis not present

## 2022-09-24 DIAGNOSIS — J301 Allergic rhinitis due to pollen: Secondary | ICD-10-CM | POA: Diagnosis not present

## 2022-10-02 DIAGNOSIS — J301 Allergic rhinitis due to pollen: Secondary | ICD-10-CM | POA: Diagnosis not present

## 2022-10-02 DIAGNOSIS — J3081 Allergic rhinitis due to animal (cat) (dog) hair and dander: Secondary | ICD-10-CM | POA: Diagnosis not present

## 2022-10-02 DIAGNOSIS — J3089 Other allergic rhinitis: Secondary | ICD-10-CM | POA: Diagnosis not present

## 2022-10-09 DIAGNOSIS — J3089 Other allergic rhinitis: Secondary | ICD-10-CM | POA: Diagnosis not present

## 2022-10-09 DIAGNOSIS — J3081 Allergic rhinitis due to animal (cat) (dog) hair and dander: Secondary | ICD-10-CM | POA: Diagnosis not present

## 2022-10-09 DIAGNOSIS — J301 Allergic rhinitis due to pollen: Secondary | ICD-10-CM | POA: Diagnosis not present

## 2022-10-15 DIAGNOSIS — J301 Allergic rhinitis due to pollen: Secondary | ICD-10-CM | POA: Diagnosis not present

## 2022-10-15 DIAGNOSIS — J3089 Other allergic rhinitis: Secondary | ICD-10-CM | POA: Diagnosis not present

## 2022-10-15 DIAGNOSIS — J3081 Allergic rhinitis due to animal (cat) (dog) hair and dander: Secondary | ICD-10-CM | POA: Diagnosis not present

## 2022-10-22 DIAGNOSIS — J301 Allergic rhinitis due to pollen: Secondary | ICD-10-CM | POA: Diagnosis not present

## 2022-10-22 DIAGNOSIS — J3081 Allergic rhinitis due to animal (cat) (dog) hair and dander: Secondary | ICD-10-CM | POA: Diagnosis not present

## 2022-10-22 DIAGNOSIS — J3089 Other allergic rhinitis: Secondary | ICD-10-CM | POA: Diagnosis not present

## 2022-10-29 DIAGNOSIS — J3089 Other allergic rhinitis: Secondary | ICD-10-CM | POA: Diagnosis not present

## 2022-10-29 DIAGNOSIS — J3081 Allergic rhinitis due to animal (cat) (dog) hair and dander: Secondary | ICD-10-CM | POA: Diagnosis not present

## 2022-10-29 DIAGNOSIS — J301 Allergic rhinitis due to pollen: Secondary | ICD-10-CM | POA: Diagnosis not present

## 2022-11-05 DIAGNOSIS — J3081 Allergic rhinitis due to animal (cat) (dog) hair and dander: Secondary | ICD-10-CM | POA: Diagnosis not present

## 2022-11-05 DIAGNOSIS — J301 Allergic rhinitis due to pollen: Secondary | ICD-10-CM | POA: Diagnosis not present

## 2022-11-05 DIAGNOSIS — J3089 Other allergic rhinitis: Secondary | ICD-10-CM | POA: Diagnosis not present

## 2022-11-12 DIAGNOSIS — J3081 Allergic rhinitis due to animal (cat) (dog) hair and dander: Secondary | ICD-10-CM | POA: Diagnosis not present

## 2022-11-12 DIAGNOSIS — J3089 Other allergic rhinitis: Secondary | ICD-10-CM | POA: Diagnosis not present

## 2022-11-12 DIAGNOSIS — J301 Allergic rhinitis due to pollen: Secondary | ICD-10-CM | POA: Diagnosis not present

## 2022-11-19 DIAGNOSIS — J3081 Allergic rhinitis due to animal (cat) (dog) hair and dander: Secondary | ICD-10-CM | POA: Diagnosis not present

## 2022-11-19 DIAGNOSIS — J301 Allergic rhinitis due to pollen: Secondary | ICD-10-CM | POA: Diagnosis not present

## 2022-11-19 DIAGNOSIS — J3089 Other allergic rhinitis: Secondary | ICD-10-CM | POA: Diagnosis not present

## 2022-11-26 DIAGNOSIS — J3089 Other allergic rhinitis: Secondary | ICD-10-CM | POA: Diagnosis not present

## 2022-11-26 DIAGNOSIS — J3081 Allergic rhinitis due to animal (cat) (dog) hair and dander: Secondary | ICD-10-CM | POA: Diagnosis not present

## 2022-11-26 DIAGNOSIS — J301 Allergic rhinitis due to pollen: Secondary | ICD-10-CM | POA: Diagnosis not present

## 2022-12-03 DIAGNOSIS — J301 Allergic rhinitis due to pollen: Secondary | ICD-10-CM | POA: Diagnosis not present

## 2022-12-03 DIAGNOSIS — J3089 Other allergic rhinitis: Secondary | ICD-10-CM | POA: Diagnosis not present

## 2022-12-03 DIAGNOSIS — J3081 Allergic rhinitis due to animal (cat) (dog) hair and dander: Secondary | ICD-10-CM | POA: Diagnosis not present

## 2022-12-10 DIAGNOSIS — J3089 Other allergic rhinitis: Secondary | ICD-10-CM | POA: Diagnosis not present

## 2022-12-10 DIAGNOSIS — J3081 Allergic rhinitis due to animal (cat) (dog) hair and dander: Secondary | ICD-10-CM | POA: Diagnosis not present

## 2022-12-10 DIAGNOSIS — J301 Allergic rhinitis due to pollen: Secondary | ICD-10-CM | POA: Diagnosis not present

## 2022-12-18 DIAGNOSIS — J301 Allergic rhinitis due to pollen: Secondary | ICD-10-CM | POA: Diagnosis not present

## 2022-12-18 DIAGNOSIS — J3081 Allergic rhinitis due to animal (cat) (dog) hair and dander: Secondary | ICD-10-CM | POA: Diagnosis not present

## 2022-12-18 DIAGNOSIS — J3089 Other allergic rhinitis: Secondary | ICD-10-CM | POA: Diagnosis not present

## 2022-12-24 DIAGNOSIS — J3089 Other allergic rhinitis: Secondary | ICD-10-CM | POA: Diagnosis not present

## 2022-12-24 DIAGNOSIS — J3081 Allergic rhinitis due to animal (cat) (dog) hair and dander: Secondary | ICD-10-CM | POA: Diagnosis not present

## 2022-12-24 DIAGNOSIS — J301 Allergic rhinitis due to pollen: Secondary | ICD-10-CM | POA: Diagnosis not present

## 2022-12-31 DIAGNOSIS — J301 Allergic rhinitis due to pollen: Secondary | ICD-10-CM | POA: Diagnosis not present

## 2022-12-31 DIAGNOSIS — J3089 Other allergic rhinitis: Secondary | ICD-10-CM | POA: Diagnosis not present

## 2022-12-31 DIAGNOSIS — J3081 Allergic rhinitis due to animal (cat) (dog) hair and dander: Secondary | ICD-10-CM | POA: Diagnosis not present

## 2023-01-02 LAB — LAB REPORT - SCANNED
A1c: 5.4
EGFR: 90

## 2023-01-07 DIAGNOSIS — J3081 Allergic rhinitis due to animal (cat) (dog) hair and dander: Secondary | ICD-10-CM | POA: Diagnosis not present

## 2023-01-07 DIAGNOSIS — J3089 Other allergic rhinitis: Secondary | ICD-10-CM | POA: Diagnosis not present

## 2023-01-07 DIAGNOSIS — J301 Allergic rhinitis due to pollen: Secondary | ICD-10-CM | POA: Diagnosis not present

## 2023-01-14 DIAGNOSIS — J3081 Allergic rhinitis due to animal (cat) (dog) hair and dander: Secondary | ICD-10-CM | POA: Diagnosis not present

## 2023-01-14 DIAGNOSIS — J3089 Other allergic rhinitis: Secondary | ICD-10-CM | POA: Diagnosis not present

## 2023-01-14 DIAGNOSIS — J301 Allergic rhinitis due to pollen: Secondary | ICD-10-CM | POA: Diagnosis not present

## 2023-01-17 ENCOUNTER — Ambulatory Visit: Payer: BC Managed Care – PPO | Admitting: Cardiology

## 2023-01-21 DIAGNOSIS — J301 Allergic rhinitis due to pollen: Secondary | ICD-10-CM | POA: Diagnosis not present

## 2023-01-21 DIAGNOSIS — J3081 Allergic rhinitis due to animal (cat) (dog) hair and dander: Secondary | ICD-10-CM | POA: Diagnosis not present

## 2023-01-21 DIAGNOSIS — J3089 Other allergic rhinitis: Secondary | ICD-10-CM | POA: Diagnosis not present

## 2023-01-25 DIAGNOSIS — N9089 Other specified noninflammatory disorders of vulva and perineum: Secondary | ICD-10-CM | POA: Diagnosis not present

## 2023-01-28 DIAGNOSIS — J301 Allergic rhinitis due to pollen: Secondary | ICD-10-CM | POA: Diagnosis not present

## 2023-01-28 DIAGNOSIS — J3081 Allergic rhinitis due to animal (cat) (dog) hair and dander: Secondary | ICD-10-CM | POA: Diagnosis not present

## 2023-01-28 DIAGNOSIS — J3089 Other allergic rhinitis: Secondary | ICD-10-CM | POA: Diagnosis not present

## 2023-01-30 DIAGNOSIS — R002 Palpitations: Secondary | ICD-10-CM | POA: Insufficient documentation

## 2023-01-30 NOTE — Progress Notes (Unsigned)
  Cardiology Office Note:   Date:  01/31/2023  ID:  Tracey Patrick, DOB 1956-06-05, MRN [id number]  History of Present Illness:   Tracey Patrick is a 67 y.o. female who was referred by Madelin Headings, MD for evaluation of cardiovascular risk factors and an elevated coronary calcium score of 4 which was 64 percentile for her age and gender.  She had a negative POET (Plain Old Exercise Treadmill) last year.  She had an enlarged aorta at 42  mm.    However, I repeated this study in June 2023 and there was no evidence of aortic enlargement.     Since I last saw her she has tried to take Crestor to manage her dyslipidemia in the face of the elevated calcium score.  Unfortunately she was starting 3 times a week but has not been able to do it.  She gets cramping.  She went down to 2 times a month and she tolerated this.  She tried again to increase the frequency and she had more muscle cramps.  Her LDL did respond to the increase Crestor but is now back up.  He is clearly intolerant to statins.  She is still exercising.  She denies any new shortness of breath, PND or orthopnea.  She has had no new palpitations, presyncope or syncope.  She is reduced her surgery so she is a little less stressed  ROS: As stated in the HPI and negative for all other systems.  Studies Reviewed:    EKG:  NA    Risk Assessment/Calculations:        Physical Exam:   VS:  BP 108/78   Pulse (!) 58   Ht  (1.575 m)   Wt 105 lb 12.8 oz (48 kg)   SpO2 99%   BMI 19.35 kg/m    Wt Readings from Last 3 Encounters:  01/31/23 105 lb 12.8 oz (48 kg)  04/06/22 106 lb (48.1 kg)  11/08/21 105 lb (47.6 kg)     GEN: Well nourished, well developed in no acute distress NECK: No JVD; No carotid bruits CARDIAC: RRR, no murmurs, rubs, gallops RESPIRATORY:  Clear to auscultation without rales, wheezing or rhonchi  ABDOMEN: Soft, non-tender, non-distended EXTREMITIES:  No edema; No deformity   ASSESSMENT AND PLAN:   ELEVATED  CORONARY CALCIUM SCORE: We are managing this aggressively with primary risk reduction.  No further cardiovascular testing.  DYSLIPIDEMIA:   She is intolerant of statin.  I am going to have her meet with our Pharm D to talk about PCSK9.  When she comes back for follow-up I would suggest also checking an LP(a).    PALPITATIONS: She is no longer bothered by this.  No change in therapy.         Signed, Rollene Rotunda, MD

## 2023-01-31 ENCOUNTER — Ambulatory Visit: Payer: Medicare Other | Attending: Cardiology | Admitting: Cardiology

## 2023-01-31 ENCOUNTER — Encounter: Payer: Self-pay | Admitting: Cardiology

## 2023-01-31 VITALS — BP 108/78 | HR 58 | Ht 62.0 in | Wt 105.8 lb

## 2023-01-31 DIAGNOSIS — R002 Palpitations: Secondary | ICD-10-CM | POA: Diagnosis not present

## 2023-01-31 DIAGNOSIS — E785 Hyperlipidemia, unspecified: Secondary | ICD-10-CM

## 2023-01-31 DIAGNOSIS — R931 Abnormal findings on diagnostic imaging of heart and coronary circulation: Secondary | ICD-10-CM | POA: Diagnosis not present

## 2023-01-31 NOTE — Patient Instructions (Signed)
Medication Instructions:  Your physician recommends that you continue on your current medications as directed. Please refer to the Current Medication list given to you today.  *If you need a refill on your cardiac medications before your next appointment, please call your pharmacy*   Lab Work: Your physician recommends that you return for lab work in 3 months: Lipids, Lp(a)  If you have labs (blood work) drawn today and your tests are completely normal, you will receive your results only by: MyChart Message (if you have MyChart) OR A paper copy in the mail If you have any lab test that is abnormal or we need to change your treatment, we will call you to review the results.   Testing/Procedures: None   Follow-Up: At [facility], you and your health needs are our priority.  As part of our continuing mission to provide you with exceptional heart care, we have created designated Provider Care Teams.  These Care Teams include your primary Cardiologist (physician) and Advanced Practice Providers (APPs -  Physician Assistants and Nurse Practitioners) who all work together to provide you with the care you need, when you need it.   Your next appointment:   1 year(s)  Provider:   Rollene Rotunda, MD

## 2023-02-04 DIAGNOSIS — J301 Allergic rhinitis due to pollen: Secondary | ICD-10-CM | POA: Diagnosis not present

## 2023-02-04 DIAGNOSIS — J3089 Other allergic rhinitis: Secondary | ICD-10-CM | POA: Diagnosis not present

## 2023-02-04 DIAGNOSIS — J3081 Allergic rhinitis due to animal (cat) (dog) hair and dander: Secondary | ICD-10-CM | POA: Diagnosis not present

## 2023-02-11 DIAGNOSIS — J3081 Allergic rhinitis due to animal (cat) (dog) hair and dander: Secondary | ICD-10-CM | POA: Diagnosis not present

## 2023-02-11 DIAGNOSIS — J3089 Other allergic rhinitis: Secondary | ICD-10-CM | POA: Diagnosis not present

## 2023-02-11 DIAGNOSIS — J301 Allergic rhinitis due to pollen: Secondary | ICD-10-CM | POA: Diagnosis not present

## 2023-02-13 DIAGNOSIS — J301 Allergic rhinitis due to pollen: Secondary | ICD-10-CM | POA: Diagnosis not present

## 2023-02-13 DIAGNOSIS — J3081 Allergic rhinitis due to animal (cat) (dog) hair and dander: Secondary | ICD-10-CM | POA: Diagnosis not present

## 2023-02-18 DIAGNOSIS — J301 Allergic rhinitis due to pollen: Secondary | ICD-10-CM | POA: Diagnosis not present

## 2023-02-18 DIAGNOSIS — J3089 Other allergic rhinitis: Secondary | ICD-10-CM | POA: Diagnosis not present

## 2023-02-18 DIAGNOSIS — J3081 Allergic rhinitis due to animal (cat) (dog) hair and dander: Secondary | ICD-10-CM | POA: Diagnosis not present

## 2023-02-25 DIAGNOSIS — J301 Allergic rhinitis due to pollen: Secondary | ICD-10-CM | POA: Diagnosis not present

## 2023-02-25 DIAGNOSIS — J3081 Allergic rhinitis due to animal (cat) (dog) hair and dander: Secondary | ICD-10-CM | POA: Diagnosis not present

## 2023-02-25 DIAGNOSIS — J3089 Other allergic rhinitis: Secondary | ICD-10-CM | POA: Diagnosis not present

## 2023-03-05 DIAGNOSIS — J3089 Other allergic rhinitis: Secondary | ICD-10-CM | POA: Diagnosis not present

## 2023-03-05 DIAGNOSIS — J301 Allergic rhinitis due to pollen: Secondary | ICD-10-CM | POA: Diagnosis not present

## 2023-03-06 ENCOUNTER — Telehealth: Payer: Self-pay | Admitting: Cardiology

## 2023-03-06 NOTE — Telephone Encounter (Signed)
Patient states she dropped off information for her Medicare part D a couple of weeks about at the office.  She wants to know if the prior auth for repatha was started yet.

## 2023-03-06 NOTE — Telephone Encounter (Signed)
Spoke to patient-reports she discussed Repatha with pharmD at [facility] with Dr. Antoine Poche.   She dropped off her updated insurance card 2 weeks ago and wanted an update.    No note in chart.  Advised would send to pharmD for update.

## 2023-03-08 ENCOUNTER — Other Ambulatory Visit (HOSPITAL_COMMUNITY): Payer: Self-pay

## 2023-03-08 ENCOUNTER — Other Ambulatory Visit (HOSPITAL_BASED_OUTPATIENT_CLINIC_OR_DEPARTMENT_OTHER): Payer: Self-pay

## 2023-03-08 ENCOUNTER — Telehealth: Payer: Self-pay

## 2023-03-08 ENCOUNTER — Telehealth: Payer: Self-pay | Admitting: Cardiology

## 2023-03-08 ENCOUNTER — Encounter: Payer: Self-pay | Admitting: Cardiology

## 2023-03-08 DIAGNOSIS — R931 Abnormal findings on diagnostic imaging of heart and coronary circulation: Secondary | ICD-10-CM

## 2023-03-08 DIAGNOSIS — E785 Hyperlipidemia, unspecified: Secondary | ICD-10-CM

## 2023-03-08 MED ORDER — REPATHA SURECLICK 140 MG/ML ~~LOC~~ SOAJ
1.0000 mL | SUBCUTANEOUS | 3 refills | Status: DC
  Filled 2023-03-08: qty 6, 84d supply, fill #0
  Filled 2023-05-21: qty 6, 84d supply, fill #1
  Filled 2023-08-24 (×2): qty 6, 84d supply, fill #2
  Filled 2023-11-23: qty 2, 28d supply, fill #3

## 2023-03-08 NOTE — Telephone Encounter (Signed)
Pharmacy Patient Advocate Encounter  Prior Authorization for REPATHA has been approved.    PA# [id number] Effective dates: 03/08/23 through 09/07/23  Haze Rushing, CPhT Pharmacy Patient Advocate Specialist Direct Number: [phone] Fax: [phone]

## 2023-03-08 NOTE — Telephone Encounter (Signed)
Pharmacy Patient Advocate Encounter   Received notification from [facility] MEDICARE that prior authorization for REPATHA is needed.    PA submitted on 03/08/23 Key [id number] Status is pending  Haze Rushing, CPhT Pharmacy Patient Advocate Specialist Direct Number: [phone] Fax: [phone]

## 2023-03-08 NOTE — Telephone Encounter (Signed)
Pt c/o medication issue:  1. Name of Medication: Repatha   2. How are you currently taking this medication (dosage and times per day)?   3. Are you having a reaction (difficulty breathing--STAT)?   4. What is your medication issue? Patient called stating she got an email from her insurance that they approved her Repatha.  She would like for it to be sent to the pharmacy at [facility].  If she can please get a callback when this is done.

## 2023-03-08 NOTE — Telephone Encounter (Signed)
See my chart message

## 2023-03-11 DIAGNOSIS — J3089 Other allergic rhinitis: Secondary | ICD-10-CM | POA: Diagnosis not present

## 2023-03-11 DIAGNOSIS — J301 Allergic rhinitis due to pollen: Secondary | ICD-10-CM | POA: Diagnosis not present

## 2023-03-11 DIAGNOSIS — J3081 Allergic rhinitis due to animal (cat) (dog) hair and dander: Secondary | ICD-10-CM | POA: Diagnosis not present

## 2023-03-18 DIAGNOSIS — J3089 Other allergic rhinitis: Secondary | ICD-10-CM | POA: Diagnosis not present

## 2023-03-18 DIAGNOSIS — J301 Allergic rhinitis due to pollen: Secondary | ICD-10-CM | POA: Diagnosis not present

## 2023-03-18 DIAGNOSIS — J3081 Allergic rhinitis due to animal (cat) (dog) hair and dander: Secondary | ICD-10-CM | POA: Diagnosis not present

## 2023-04-01 ENCOUNTER — Other Ambulatory Visit (HOSPITAL_COMMUNITY): Payer: Self-pay

## 2023-04-09 ENCOUNTER — Other Ambulatory Visit: Payer: Self-pay | Admitting: Obstetrics and Gynecology

## 2023-04-09 DIAGNOSIS — R9230 Dense breasts, unspecified: Secondary | ICD-10-CM

## 2023-04-25 ENCOUNTER — Ambulatory Visit
Admission: RE | Admit: 2023-04-25 | Discharge: 2023-04-25 | Disposition: A | Discharge status: Home / Self Care | Payer: No Typology Code available for payment source | Source: Ambulatory Visit | Attending: Obstetrics and Gynecology | Admitting: Obstetrics and Gynecology

## 2023-04-25 ENCOUNTER — Other Ambulatory Visit: Payer: Self-pay | Admitting: Obstetrics and Gynecology

## 2023-04-25 DIAGNOSIS — R9230 Dense breasts, unspecified: Secondary | ICD-10-CM

## 2023-04-25 MED ORDER — GADOPICLENOL 0.5 MMOL/ML IV SOLN
5.0000 mL | Freq: Once | INTRAVENOUS | Status: AC | PRN
  Administered 2023-04-25: 5 mL via INTRAVENOUS

## 2023-04-28 ENCOUNTER — Other Ambulatory Visit: Payer: Self-pay | Admitting: Cardiology

## 2023-05-21 ENCOUNTER — Encounter: Payer: Self-pay | Admitting: Cardiology

## 2023-05-21 ENCOUNTER — Other Ambulatory Visit (HOSPITAL_BASED_OUTPATIENT_CLINIC_OR_DEPARTMENT_OTHER): Payer: Self-pay

## 2023-06-27 LAB — HM DEXA SCAN

## 2023-07-20 ENCOUNTER — Encounter: Payer: Self-pay | Admitting: Internal Medicine

## 2023-08-24 ENCOUNTER — Other Ambulatory Visit (HOSPITAL_COMMUNITY): Payer: Self-pay

## 2023-08-24 ENCOUNTER — Other Ambulatory Visit (HOSPITAL_BASED_OUTPATIENT_CLINIC_OR_DEPARTMENT_OTHER): Payer: Self-pay

## 2023-09-07 ENCOUNTER — Encounter: Payer: Self-pay | Admitting: Internal Medicine

## 2023-09-09 NOTE — Telephone Encounter (Signed)
Osteoporosis -2.7 LFN per Dr Vincente Poli Dexa

## 2023-09-11 ENCOUNTER — Other Ambulatory Visit (HOSPITAL_BASED_OUTPATIENT_CLINIC_OR_DEPARTMENT_OTHER): Payer: Self-pay

## 2023-09-11 ENCOUNTER — Ambulatory Visit: Payer: Medicare Other | Admitting: Internal Medicine

## 2023-09-11 ENCOUNTER — Encounter: Payer: Self-pay | Admitting: Internal Medicine

## 2023-09-11 VITALS — BP 120/78 | HR 55 | Temp 97.7°F | Ht 62.0 in | Wt 105.8 lb

## 2023-09-11 DIAGNOSIS — Z87898 Personal history of other specified conditions: Secondary | ICD-10-CM

## 2023-09-11 DIAGNOSIS — E785 Hyperlipidemia, unspecified: Secondary | ICD-10-CM | POA: Diagnosis not present

## 2023-09-11 DIAGNOSIS — M810 Age-related osteoporosis without current pathological fracture: Secondary | ICD-10-CM | POA: Diagnosis not present

## 2023-09-11 DIAGNOSIS — I7789 Other specified disorders of arteries and arterioles: Secondary | ICD-10-CM

## 2023-09-11 DIAGNOSIS — R5383 Other fatigue: Secondary | ICD-10-CM

## 2023-09-11 DIAGNOSIS — Z23 Encounter for immunization: Secondary | ICD-10-CM | POA: Diagnosis not present

## 2023-09-11 DIAGNOSIS — R931 Abnormal findings on diagnostic imaging of heart and coronary circulation: Secondary | ICD-10-CM | POA: Diagnosis not present

## 2023-09-11 DIAGNOSIS — R238 Other skin changes: Secondary | ICD-10-CM

## 2023-09-11 MED ORDER — SCOPOLAMINE 1 MG/3DAYS TD PT72
1.0000 | MEDICATED_PATCH | TRANSDERMAL | 1 refills | Status: DC
  Filled 2023-09-11 – 2023-12-17 (×2): qty 10, 30d supply, fill #0

## 2023-09-11 NOTE — Progress Notes (Signed)
Chief Complaint  Patient presents with   Annual Exam    HPI: Patient  Tracey Patrick  67 y.o. comes in today for preventive and  problem visit  Under evalul for osteoporosis frm dr Vincente Poli  recnet dex and told to increase calcium to 2000 mg per day so taking one 4 x per day  not sure of dose of vit d  Working on weigfht bearing exercise and some resistance .  To go on around the work trip 28 days  soon and  update immunizations. Also motion sick patches if possible  STill on repatha and low dose statin for aggressive intervention per Dr hoicrein for elevated ct calcium score.  Had dx of enlarged aortic root  on imaging but on last ct angio was felt to not be present...  Tired a bit  and ring finger ridged nails    taking iron and b12 ? If could be iron deficient  Health Maintenance  Topic Date Due   Medicare Annual Wellness (AWV)  Never done   COVID-19 Vaccine (4 - 2023-24 season) 09/27/2023 (Originally 09/11/2023)   HIV Screening  09/10/2024 (Originally 12/28/1970)   Cervical Cancer Screening (HPV/Pap Cotest)  07/21/2024   MAMMOGRAM  04/24/2025   DTaP/Tdap/Td (3 - Td or Tdap) 07/17/2026   Colonoscopy  02/09/2029   Pneumonia Vaccine 86+ Years old  Completed   INFLUENZA VACCINE  Completed   DEXA SCAN  Completed   Hepatitis C Screening  Completed   Zoster Vaccines- Shingrix  Completed   HPV VACCINES  Aged Out   Health Maintenance Review LIFESTYLE:  Exercise:   180 - 200 min and  high. impact .   Upper body weights  Tobacco/ETS:n Alcohol:  1 per night Sugar beverages:  no Sleep: about 7.5  Drug use: no HH of  2 2 dogs.  Work: part time .  Ophthalmologist   ROS:  GEN/ HEENT: No fever, significant weight changes sweats headaches vision problems hearing changes, CV/ PULM; No chest pain shortness of breath cough, syncope,edema  change in exercise tolerance. GI /GU: No adominal pain, vomiting, change in bowel habits. No blood in the stool. No significant GU symptoms. SKIN/HEME:  ,no acute skin rashes suspicious lesions or bleeding. No lymphadenopathy, nodules, masses.  NEURO/ PSYCH:  No neurologic signs such as weakness numbness. No depression anxiety. IMM/ Allergy: No unusual infections.  Allergy .   REST of 12 system review negative except as per HPI   Past Medical History:  Diagnosis Date   CHEST PAIN, ATYPICAL 09/24/2008   Qualifier: Diagnosis of  By: Lawernce Ion, CMA (AAMA), Shannon S    COLONIC POLYPS, HX OF 10/04/2008   Qualifier: Diagnosis of  By: Fabian Sharp MD, Neta Mends    Desensitization to allergy shot    [name]  2016   Endometriosis    ENDOMETRIOSIS 09/24/2008   Qualifier: Diagnosis of  By: Lawernce Ion, CMA (AAMA), Bethann Berkshire    HEMORRHOIDS 09/24/2008   Qualifier: Diagnosis of  By: Lawernce Ion, CMA (AAMA), Bethann Berkshire    SCOLIOSIS 09/24/2008   Qualifier: Diagnosis of  By: Lawernce Ion, CMA (AAMA), Bethann Berkshire     Past Surgical History:  Procedure Laterality Date   ENDOMETRIAL ABLATION  2004   LIPOSUCTION  2019   OSTEOTOMY  1984   for overbite   maxillary     Family History  Problem Relation Age of Onset   Breast cancer Mother        died mva age 55   Heart attack  Mother 81   Hyperlipidemia Mother    Macular degeneration Father    CVA Father        felt secondary to Vioxx   Hyperlipidemia Father    Hyperlipidemia Brother        3 brothes    Alcohol abuse Paternal Grandfather     Social History   Socioeconomic History   Marital status: Married    Spouse name: Not on file   Number of children: Not on file   Years of education: Not on file   Highest education level: Professional school degree (e.g., MD, DDS, DVM, JD)  Occupational History   Not on file  Tobacco Use   Smoking status: Never   Smokeless tobacco: Never  Vaping Use   Vaping status: Never Used  Substance and Sexual Activity   Alcohol use: Yes    Alcohol/week: 6.0 standard drinks of alcohol    Types: 6 Glasses of wine per week    Comment: wine nightly   Drug use: No   Sexual  activity: Not on file  Other Topics Concern   Not on file  Social History Narrative   hh of 2  Married    MD opthalmologist  Never tobacco    G1P0   Social Determinants of Health   Financial Resource Strain: Low Risk  (09/07/2023)   Overall Financial Resource Strain (CARDIA)    Difficulty of Paying Living Expenses: Not hard at all  Food Insecurity: No Food Insecurity (09/07/2023)   Hunger Vital Sign    Worried About Running Out of Food in the Last Year: Never true    Ran Out of Food in the Last Year: Never true  Transportation Needs: No Transportation Needs (09/07/2023)   PRAPARE - Administrator, Civil Service (Medical): No    Lack of Transportation (Non-Medical): No  Physical Activity: Sufficiently Active (09/07/2023)   Exercise Vital Sign    Days of Exercise per Week: 5 days    Minutes of Exercise per Session: 40 min  Stress: No Stress Concern Present (09/07/2023)   [employer] of Occupational Health - Occupational Stress Questionnaire    Feeling of Stress : Not at all  Social Connections: Unknown (09/07/2023)   Social Connection and Isolation Panel [NHANES]    Frequency of Communication with Friends and Family: More than three times a week    Frequency of Social Gatherings with Friends and Family: Once a week    Attends Religious Services: Patient declined    Database administrator or Organizations: No    Attends Engineer, structural: Not on file    Marital Status: Married    Outpatient Medications Prior to Visit  Medication Sig Dispense Refill   CALCIUM CARBONATE-VIT D-MIN PO Take by mouth. 500 MG OF CALCIUM     co-enzyme Q-10 50 MG capsule Take 50 mg by mouth daily.     cycloSPORINE (RESTASIS) 0.05 % ophthalmic emulsion Place 1 drop into both eyes 2 (two) times daily.     docusate sodium (COLACE) 100 MG capsule Take 100 mg by mouth daily.      Evolocumab (REPATHA SURECLICK) 140 MG/ML SOAJ Inject 140 mg into the skin every 14 (fourteen) days.  6 mL 3   Ferrous Fumarate (IRON) 18 MG TBCR Take by mouth 2 (two) times a week.     Lutein 20 MG CAPS Take 20 mg by mouth daily.     Multiple Vitamin (MULTIVITAMIN) tablet Take 1 tablet by mouth daily.  Omega-3 Fatty Acids (FISH OIL PO) Take 1 g by mouth daily.     OVER THE COUNTER MEDICATION ZEAXANTHIN - 2 MG DAILY     PRESCRIPTION MEDICATION ALLERGY SHOTS     Psyllium (METAMUCIL FIBER PO) Take 20 g by mouth daily.     rosuvastatin (CRESTOR) 10 MG tablet TAKE 1 TABLET ONCE DAILY. (Patient taking differently: 3 (three) times a week.) 90 tablet 3   Turmeric (QC TUMERIC COMPLEX) 500 MG CAPS      HYDROcodone bit-homatropine (HYCODAN) 5-1.5 MG/5ML syrup Take 5 mLs by mouth every 8 (eight) hours as needed for cough. (Patient not taking: Reported on 09/11/2023) 120 mL 0   No facility-administered medications prior to visit.     EXAM:  BP 120/78 (BP Location: Left Arm, Patient Position: Sitting, Cuff Size: Normal)   Pulse (!) 55   Temp 97.7 F (36.5 C) (Oral)   Ht 5\' 2"  (1.575 m)   Wt 105 lb 12.8 oz (48 kg)   SpO2 98%   BMI 19.35 kg/m   Body mass index is 19.35 kg/m. Wt Readings from Last 3 Encounters:  09/11/23 105 lb 12.8 oz (48 kg)  01/31/23 105 lb 12.8 oz (48 kg)  04/06/22 106 lb (48.1 kg)    Physical Exam: Vital signs reviewed GEX:BMWU is a well-developed well-nourished alert cooperative    who appearsr stated age in no acute distress.  HEENT: normocephalic atraumatic , Eyes: PERRLglasses full, conjunctiva clear, Nares: paten,t no deformity discharge or tenderness., Ears: no deformity EAC's clear TMs with normal landmarks. Mouth: clear OP, no lesions, edema.  Moist mucous membranes. Dentition in adequate repair. NECK: supple without masses, thyromegaly or bruits. CHEST/PULM:  Clear to auscultation and percussion breath sounds equal no wheeze , rales or rhonchi. No chest wall deformities or tenderness. Breast: normal by inspection . No dimpling, discharge, masses, tenderness  or discharge . CV: PMI is nondisplaced, S1 S2 no gallops, murmurs, rubs. Peripheral pulses are full without delay.No JVD .  ABDOMEN: Bowel sounds normal nontender  No guard or rebound, no hepato splenomegal no CVA tenderness.  No hernia. Extremtities:  No clubbing cyanosis or edema, no acute joint swelling or redness no focal atrophy NEURO:  Oriented x3, cranial nerves 3-12 appear to be intact, no obvious focal weakness,gait within normal limits no abnormal reflexes or asymmetrical SKIN: No acute rashes normal turgor, color, no bruising or petechiae. Nails  clear but some ridgnin righ ring but no deformity  PSYCH: Oriented, good eye contact, no obvious depression anxiety, cognition and judgment appear normal. LN: no cervical axillary adenopathy  Lab Results  Component Value Date   WBC 5.4 01/05/2022   HGB 12.5 01/05/2022   HCT 37 01/05/2022   PLT 390 01/05/2022   GLUCOSE 79 04/28/2020   CHOL 162 05/24/2023   TRIG 40 05/24/2023   HDL 108 05/24/2023   LDLDIRECT 109.2 09/24/2008   LDLCALC 45 05/24/2023   ALT 21 01/05/2022   AST 25 01/05/2022   NA 135 (A) 01/05/2022   K 4.9 01/05/2022   CL 94 (A) 01/05/2022   CREATININE 0.8 01/05/2022   BUN 8 01/05/2022   CO2 24 04/28/2020   TSH 1.13 01/05/2022   HGBA1C 5.2 12/23/2019    BP Readings from Last 3 Encounters:  09/11/23 120/78  01/31/23 108/78  04/06/22 100/76   Had doctors days lab  and ok  ASSESSMENT AND PLAN:  Discussed the following assessment and plan:    ICD-10-CM   1. Osteoporosis, unspecified osteoporosis type,  unspecified pathological fracture presence  M81.0 CBC with Differential/Platelet    Vitamin D, 25-hydroxy    Iron, TIBC and Ferritin Panel    Basic Metabolic Panel    2. Tired  R53.83 CBC with Differential/Platelet    Vitamin D, 25-hydroxy    Iron, TIBC and Ferritin Panel    Basic Metabolic Panel    3. Elevated coronary artery calcium score  R93.1 CBC with Differential/Platelet    Vitamin D, 25-hydroxy     Iron, TIBC and Ferritin Panel    Basic Metabolic Panel    4. Dyslipidemia  E78.5 CBC with Differential/Platelet    Vitamin D, 25-hydroxy    Iron, TIBC and Ferritin Panel    Basic Metabolic Panel    5. Need for pneumococcal vaccine  Z23 Pneumococcal conjugate vaccine 20-valent (Prevnar 20)    6. History of motion sickness  Z87.898     7. Longitudinal ridging of nail  R23.8     8. Aortic root enlargement (HCC)  I77.89     Form completed and signed  for trip ok  Prevnar 20 today  Lab to include iron bmp and vit d studies  fore bone health issues  Explained most form  for future  She prfers no med for bone but if dexa declining bone density consider  consult with dr Elvera Lennox  fo more opinion.  Optimize resistance  exercise Will rx transderm patches  Return for depending on results and as discussed .  Patient Care Team: Coley Kulikowski, Neta Mends, MD as PCP - General (Internal Medicine) Rollene Rotunda, MD as PCP - Cardiology (Cardiology) Sharrell Ku, MD as Consulting Physician (Gastroenterology) Marcelle Overlie, MD as Consulting Physician (Obstetrics and Gynecology) Patient Instructions  Good to see you today . Prevnar 20  Can get tdap at pharmacy. Transderm scop patch ok for motion sickness. Continue lifestyle intervention healthy eating and exercise .  Checking vit d and iron studies  and update bmp. Neta Mends. Teandre Hamre M.D.

## 2023-09-11 NOTE — Assessment & Plan Note (Signed)
Reported as not present on recent cta  2023

## 2023-09-11 NOTE — Patient Instructions (Addendum)
Good to see you today . Prevnar 20  Can get tdap at pharmacy. Transderm scop patch ok for motion sickness. Continue lifestyle intervention healthy eating and exercise .  Checking vit d and iron studies  and update bmp.

## 2023-09-12 LAB — CBC WITH DIFFERENTIAL/PLATELET
Basophils Absolute: 0 10*3/uL (ref 0.0–0.1)
Basophils Relative: 0.5 % (ref 0.0–3.0)
Eosinophils Absolute: 0.1 10*3/uL (ref 0.0–0.7)
Eosinophils Relative: 1.8 % (ref 0.0–5.0)
HCT: 37.8 % (ref 36.0–46.0)
Hemoglobin: 13.1 g/dL (ref 12.0–15.0)
Lymphocytes Relative: 39.1 % (ref 12.0–46.0)
Lymphs Abs: 2.1 10*3/uL (ref 0.7–4.0)
MCHC: 34.6 g/dL (ref 30.0–36.0)
MCV: 96.5 fL (ref 78.0–100.0)
Monocytes Absolute: 0.5 10*3/uL (ref 0.1–1.0)
Monocytes Relative: 9.3 % (ref 3.0–12.0)
Neutro Abs: 2.7 10*3/uL (ref 1.4–7.7)
Neutrophils Relative %: 49.3 % (ref 43.0–77.0)
Platelets: 281 10*3/uL (ref 150.0–400.0)
RBC: 3.91 Mil/uL (ref 3.87–5.11)
RDW: 12.4 % (ref 11.5–15.5)
WBC: 5.4 10*3/uL (ref 4.0–10.5)

## 2023-09-12 LAB — BASIC METABOLIC PANEL
BUN: 17 mg/dL (ref 6–23)
CO2: 30 meq/L (ref 19–32)
Calcium: 10.5 mg/dL (ref 8.4–10.5)
Chloride: 100 meq/L (ref 96–112)
Creatinine, Ser: 0.68 mg/dL (ref 0.40–1.20)
GFR: 91.21 mL/min (ref >60.00)
Glucose, Bld: 96 mg/dL (ref 70–99)
Potassium: 4.2 meq/L (ref 3.5–5.1)
Sodium: 137 meq/L (ref 135–145)

## 2023-09-12 LAB — VITAMIN D 25 HYDROXY (VIT D DEFICIENCY, FRACTURES): VITD: 72.29 ng/mL (ref 30.00–100.00)

## 2023-09-12 LAB — IRON,TIBC AND FERRITIN PANEL
%SAT: 25 % (ref 16–45)
Ferritin: 74 ng/mL (ref 16–288)
Iron: 85 ug/dL (ref 45–160)
TIBC: 341 ug/dL (ref 250–450)

## 2023-09-16 NOTE — Progress Notes (Signed)
Results are all in range  inc vit d and iron panel All reassuring

## 2023-09-23 ENCOUNTER — Other Ambulatory Visit (HOSPITAL_BASED_OUTPATIENT_CLINIC_OR_DEPARTMENT_OTHER): Payer: Self-pay

## 2023-09-27 ENCOUNTER — Other Ambulatory Visit (HOSPITAL_BASED_OUTPATIENT_CLINIC_OR_DEPARTMENT_OTHER): Payer: Self-pay

## 2023-11-23 ENCOUNTER — Other Ambulatory Visit (HOSPITAL_BASED_OUTPATIENT_CLINIC_OR_DEPARTMENT_OTHER): Payer: Self-pay

## 2023-12-09 ENCOUNTER — Encounter: Payer: Self-pay | Admitting: Cardiology

## 2023-12-09 ENCOUNTER — Other Ambulatory Visit (HOSPITAL_COMMUNITY): Payer: Self-pay

## 2023-12-09 ENCOUNTER — Telehealth: Payer: Self-pay | Admitting: Pharmacy Technician

## 2023-12-09 DIAGNOSIS — E785 Hyperlipidemia, unspecified: Secondary | ICD-10-CM

## 2023-12-09 DIAGNOSIS — R931 Abnormal findings on diagnostic imaging of heart and coronary circulation: Secondary | ICD-10-CM

## 2023-12-09 NOTE — Telephone Encounter (Signed)
 Pharmacy Patient Advocate Encounter   Received notification from Physician's Office that prior authorization for Repatha is required/requested.   Insurance verification completed.   The patient is insured through [employer] .   Per test claim: PA required; PA submitted to above mentioned insurance via CoverMyMeds Key/confirmation #/EOC [id number] Status is pending

## 2023-12-11 ENCOUNTER — Other Ambulatory Visit (HOSPITAL_COMMUNITY): Payer: Self-pay

## 2023-12-11 NOTE — Telephone Encounter (Signed)
 Pharmacy Patient Advocate Encounter  Received notification from [facility] that Prior Authorization for Repatha has been APPROVED from 12/11/23 to 10/07/24. Unable to obtain price due to refill too soon rejection, last fill date 11/23/23 next available fill date03/08/25   PA #/Case ID/Reference #: [id number]

## 2023-12-15 ENCOUNTER — Encounter: Payer: Self-pay | Admitting: Internal Medicine

## 2023-12-17 ENCOUNTER — Other Ambulatory Visit (HOSPITAL_BASED_OUTPATIENT_CLINIC_OR_DEPARTMENT_OTHER): Payer: Self-pay

## 2023-12-17 MED ORDER — REPATHA SURECLICK 140 MG/ML ~~LOC~~ SOAJ
1.0000 mL | SUBCUTANEOUS | 3 refills | Status: AC
  Filled 2023-12-17 – 2024-01-20 (×2): qty 6, 84d supply, fill #0
  Filled 2024-04-16 – 2024-04-17 (×2): qty 6, 84d supply, fill #1
  Filled 2024-07-08: qty 6, 84d supply, fill #2
  Filled 2024-10-14: qty 6, 84d supply, fill #3

## 2023-12-17 NOTE — Addendum Note (Signed)
 Addended by: Cheree Ditto on: 12/17/2023 09:23 AM   Modules accepted: Orders

## 2023-12-25 LAB — LIPID PANEL
HDL: 102 — AB (ref 35–70)
LDL Cholesterol: 57
Triglycerides: 47 (ref 40–160)

## 2023-12-25 LAB — COMPREHENSIVE METABOLIC PANEL WITH GFR
Albumin: 4.5 (ref 3.5–5.0)
Calcium: 9.9 (ref 8.7–10.7)
Globulin: 2.1
eGFR: 91

## 2023-12-25 LAB — BASIC METABOLIC PANEL WITH GFR
BUN: 17 (ref 4–21)
CO2: 23 — AB (ref 13–22)
Chloride: 100 (ref 99–108)
Creatinine: 0.7 (ref 0.5–1.1)
Glucose: 122
Potassium: 4.8 meq/L (ref 3.5–5.1)
Sodium: 137 (ref 137–147)

## 2023-12-25 LAB — TSH: TSH: 1.18 (ref 0.41–5.90)

## 2023-12-25 LAB — HEPATIC FUNCTION PANEL
ALT: 16 U/L (ref 7–35)
AST: 29 (ref 13–35)

## 2023-12-25 LAB — HEMOGLOBIN A1C: Hemoglobin A1C: 5.2

## 2024-01-20 ENCOUNTER — Other Ambulatory Visit (HOSPITAL_BASED_OUTPATIENT_CLINIC_OR_DEPARTMENT_OTHER): Payer: Self-pay

## 2024-01-20 MED ORDER — ROSUVASTATIN CALCIUM 10 MG PO TABS
10.0000 mg | ORAL_TABLET | Freq: Every day | ORAL | 3 refills | Status: DC
  Filled 2024-01-20: qty 90, 90d supply, fill #0

## 2024-01-26 ENCOUNTER — Encounter: Payer: Self-pay | Admitting: Cardiology

## 2024-01-26 ENCOUNTER — Encounter: Payer: Self-pay | Admitting: Internal Medicine

## 2024-01-27 ENCOUNTER — Other Ambulatory Visit (HOSPITAL_BASED_OUTPATIENT_CLINIC_OR_DEPARTMENT_OTHER): Payer: Self-pay

## 2024-01-27 ENCOUNTER — Encounter: Payer: Self-pay | Admitting: Internal Medicine

## 2024-01-27 MED ORDER — DOXYCYCLINE HYCLATE 100 MG PO CAPS
100.0000 mg | ORAL_CAPSULE | Freq: Two times a day (BID) | ORAL | 0 refills | Status: DC
  Filled 2024-01-27: qty 10, 5d supply, fill #0

## 2024-02-27 ENCOUNTER — Encounter: Payer: Self-pay | Admitting: Internal Medicine

## 2024-02-27 DIAGNOSIS — M810 Age-related osteoporosis without current pathological fracture: Secondary | ICD-10-CM

## 2024-03-08 NOTE — Telephone Encounter (Signed)
 Soreerral to  endocrinlogy  Dr Aldona Amel preferred but I am not sure she is taking new patients .  K please do referral request DR.G for osteoporosi or other  [facility] . Dx osteoporosis without fracture

## 2024-03-14 NOTE — Progress Notes (Unsigned)
  Cardiology Office Note:   Date:  03/16/2024  ID:  Tracey Patrick, DOB Jan 30, 1956, MRN [id number] PCP: Tracey Capra, MD  [facility] Providers Cardiologist:  Eilleen Grates, MD {  History of Present Illness:   Tracey Patrick is a 68 y.o. female who was referred by Tracey Capra, MD for evaluation of cardiovascular risk factors and an elevated coronary calcium  score of 4 which was 64 percentile for her age and gender.  She had a negative POET (Plain Old Exercise Treadmill) last year.  She had an enlarged aorta at 42  mm.    However, I repeated this study in June 2023 and there was no evidence of aortic enlargement.     Since I last saw her she has had no new cardiac problems.  The patient denies any new symptoms such as chest discomfort, neck or arm discomfort. There has been no new shortness of breath, PND or orthopnea. There have been no reported palpitations, presyncope or syncope.   She exercises most days.      ROS: As stated in the HPI and negative for all other systems.  Studies Reviewed:    EKG:   EKG Interpretation Date/Time:  Monday March 16 2024 09:00:26 EDT Ventricular Rate:  57 PR Interval:  190 QRS Duration:  76 QT Interval:  440 QTC Calculation: 428 R Axis:   37  Text Interpretation: Sinus bradycardia No significant change since last tracing Confirmed by Eilleen Grates ([id number]) on 03/16/2024 9:16:26 AM     Risk Assessment/Calculations:              Physical Exam:   VS:  BP 120/76 (BP Location: Right Arm, Patient Position: Sitting, Cuff Size: Normal)   Pulse (!) 57   Ht 5\' 2"  (1.575 m)   Wt 108 lb 9.6 oz (49.3 kg)   SpO2 97%   BMI 19.86 kg/m    Wt Readings from Last 3 Encounters:  03/16/24 108 lb 9.6 oz (49.3 kg)  09/11/23 105 lb 12.8 oz (48 kg)  01/31/23 105 lb 12.8 oz (48 kg)     GEN: Well nourished, well developed in no acute distress NECK: No JVD; No carotid bruits CARDIAC: RRR, very brief diastolic murmur heard at the apex, no systolic  murmurs, rubs, gallops RESPIRATORY:  Clear to auscultation without rales, wheezing or rhonchi  ABDOMEN: Soft, non-tender, non-distended EXTREMITIES:  No edema; No deformity   ASSESSMENT AND PLAN:   ELEVATED CORONARY CALCIUM  SCORE: She is continue with aggressive risk reduction.  No change in therapy.   MURMUR: She has some mild diastolic murmur and some very trivial aortic insufficiency.  This has not changed clinically.  No further imaging.    DYSLIPIDEMIA: She is taking very low-dose statin which is all she tolerates.  Her LDL is 57.  HDL 102.  No change in therapy.  LP(a) was not elevated.    PALPITATIONS: She is optically bothered by these.  No change in therapy.        Follow up with me in 1 year  Signed, Eilleen Grates, MD

## 2024-03-16 ENCOUNTER — Encounter: Payer: Self-pay | Admitting: Cardiology

## 2024-03-16 ENCOUNTER — Ambulatory Visit: Payer: Medicare Other | Attending: Internal Medicine | Admitting: Cardiology

## 2024-03-16 VITALS — BP 120/76 | HR 57 | Ht 62.0 in | Wt 108.6 lb

## 2024-03-16 DIAGNOSIS — E785 Hyperlipidemia, unspecified: Secondary | ICD-10-CM | POA: Diagnosis not present

## 2024-03-16 DIAGNOSIS — R931 Abnormal findings on diagnostic imaging of heart and coronary circulation: Secondary | ICD-10-CM | POA: Diagnosis not present

## 2024-03-16 DIAGNOSIS — R002 Palpitations: Secondary | ICD-10-CM | POA: Insufficient documentation

## 2024-03-16 NOTE — Patient Instructions (Signed)

## 2024-04-16 ENCOUNTER — Ambulatory Visit: Admitting: Family Medicine

## 2024-04-16 ENCOUNTER — Other Ambulatory Visit (HOSPITAL_BASED_OUTPATIENT_CLINIC_OR_DEPARTMENT_OTHER): Payer: Self-pay

## 2024-04-17 ENCOUNTER — Other Ambulatory Visit (HOSPITAL_BASED_OUTPATIENT_CLINIC_OR_DEPARTMENT_OTHER): Payer: Self-pay

## 2024-06-18 ENCOUNTER — Encounter: Payer: Self-pay | Admitting: Internal Medicine

## 2024-06-25 ENCOUNTER — Encounter: Payer: Self-pay | Admitting: Internal Medicine

## 2024-06-29 ENCOUNTER — Encounter: Payer: Self-pay | Admitting: Internal Medicine

## 2024-06-29 ENCOUNTER — Ambulatory Visit (INDEPENDENT_AMBULATORY_CARE_PROVIDER_SITE_OTHER): Admitting: Internal Medicine

## 2024-06-29 VITALS — BP 106/60 | HR 61 | Ht 61.0 in | Wt 109.6 lb

## 2024-06-29 DIAGNOSIS — M810 Age-related osteoporosis without current pathological fracture: Secondary | ICD-10-CM | POA: Diagnosis not present

## 2024-06-29 NOTE — Patient Instructions (Addendum)
 Please reduce calcium  to 500 mg daily.  Let's start Prolia.  Please return 1 year.  How Can I Prevent Falls? Men and women with osteoporosis need to take care not to fall down. Falls can break bones. Some reasons people fall are: Poor vision  Poor balance  Certain diseases that affect how you walk  Some types of medicine, such as sleeping pills.  Some tips to help prevent falls outdoors are: Use a cane or walker  Wear rubber-soled shoes so you don't slip  Walk on grass when sidewalks are slippery  In winter, put salt or kitty litter on icy sidewalks.  Some ways to help prevent falls indoors are: Keep rooms free of clutter, especially on floors  Use plastic or carpet runners on slippery floors  Wear low-heeled shoes that provide good support  Do not walk in socks, stockings, or slippers  Be sure carpets and area rugs have skid-proof backs or are tacked to the floor  Be sure stairs are well lit and have rails on both sides  Put grab bars on bathroom walls near tub, shower, and toilet  Use a rubber bath mat in the shower or tub  Keep a flashlight next to your bed  Use a sturdy step stool with a handrail and wide steps  Add more lights in rooms (and night lights) Buy a cordless phone to keep with you so that you don't have to rush to the phone   when it rings and so that you can call for help if you fall.   (adapted from http://www.niams.[employer].at)   Exercise for Strong Bones (from [facility] [employer]) There are two types of exercises that are important for building and maintaining bone density:  weight-bearing and muscle-strengthening exercises. Weight-bearing Exercises These exercises include activities that make you move against gravity while staying upright. Weight-bearing exercises can be high-impact or low-impact. High-impact weight-bearing exercises help build bones and keep them strong. If you have broken a bone  due to osteoporosis or are at risk of breaking a bone, you may need to avoid high-impact exercises. If you're not sure, you should check with your healthcare provider. Examples of high-impact weight-bearing exercises are: Dancing Doing high-impact aerobics Hiking Jogging/running Jumping Rope Stair climbing Tennis Low-impact weight-bearing exercises can also help keep bones strong and are a safe alternative if you cannot do high-impact exercises. Examples of low-impact weight-bearing exercises are: Using elliptical training machines Doing low-impact aerobics Using stair-step machines Fast walking on a treadmill or outside Muscle-Strengthening Exercises These exercises include activities where you move your body, a weight or some other resistance against gravity. They are also known as resistance exercises and include: Lifting weights Using elastic exercise bands Using weight machines Lifting your own body weight Functional movements, such as standing and rising up on your toes Yoga and Pilates can also improve strength, balance and flexibility. However, certain positions may not be safe for people with osteoporosis or those at increased risk of broken bones. For example, exercises that have you bend forward may increase the chance of breaking a bone in the spine. A physical therapist should be able to help you learn which exercises are safe and appropriate for you. Non-Impact Exercises Non-impact exercises can help you to improve balance, posture and how well you move in everyday activities. These exercises can also help to increase muscle strength and decrease the risk of falls and broken bones. Some of these exercises include: Balance exercises that strengthen your legs and test your balance, such  as Tai Chi, can decrease your risk of falls. Posture exercises that improve your posture and reduce rounded or "sloping" shoulders can help you decrease the chance of breaking a bone, especially  in the spine. Functional exercises that improve how well you move can help you with everyday activities and decrease your chance of falling and breaking a bone. For example, if you have trouble getting up from a chair or climbing stairs, you should do these activities as exercises. A physical therapist can teach you balance, posture and functional exercises. Starting a New Exercise Program If you haven't exercised regularly for a while, check with your healthcare provider before beginning a new exercise program--particularly if you have health problems such as heart disease, diabetes or high blood pressure. If you're at high risk of breaking a bone, you should work with a physical therapist to develop a safe exercise program. Once you have your healthcare provider's approval, start slowly. If you've already broken bones in the spine because of osteoporosis, be very careful to avoid activities that require reaching down, bending forward, rapid twisting motions, heavy lifting and those that increase your chance of a fall. As you get started, your muscles may feel sore for a day or two after you exercise. If soreness lasts longer, you may be working too hard and need to ease up. Exercises should be done in a pain-free range of motion. How Much Exercise Do You Need? Weight-bearing exercises 30 minutes on most days of the week. Do a 30-minutesession or multiple sessions spread out throughout the day. The benefits to your bones are the same.   Muscle-strengthening exercises Two to three days per week. If you don't have much time for strengthening/resistance training, do small amounts at a time. You can do just one body part each day. For example do arms one day, legs the next and trunk the next. You can also spread these exercises out during your normal day.  Balance, posture and functional exercises Every day or as often as needed. You may want to focus on one area more than the others. If you have fallen or  lose your balance, spend time doing balance exercises. If you are getting rounded shoulders, work more on posture exercises. If you have trouble climbing stairs or getting up from the couch, do more functional exercises. You can also perform these exercises at one time or spread them during your day. Work with a phyiscal therapist to learn the right exercises for you.   Please check out the following book about best diet for bone health:   Denosumab: Patient drug information (Up-to-date) Copyright (630) 055-6175 [employer] All rights reserved.  Brand Names: [place]  Prolia;  Xgeva What is this drug used for?  It is used to treat soft, brittle bones (osteoporosis).  It is used for bone growth.  It is used when treating some cancers.  It may be given to you for other reasons. Talk with the doctor. What do I need to tell my doctor BEFORE I take this drug?  All products:  If you have an allergy to denosumab or any other part of this drug.  If you are allergic to any drugs like this one, any other drugs, foods, or other substances. Tell your doctor about the allergy and what signs you had, like rash; hives; itching; shortness of breath; wheezing; cough; swelling of face, lips, tongue, or throat; or any other signs.  If you have low calcium  levels.  ProliaT:  If you are  pregnant or may be pregnant. Do not take this drug if you are pregnant.  This is not a list of all drugs or health problems that interact with this drug.  Tell your doctor and pharmacist about all of your drugs (prescription or OTC, natural products, vitamins) and health problems. You must check to make sure that it is safe for you to take this drug with all of your drugs and health problems. Do not start, stop, or change the dose of any drug without checking with your doctor. What are some things I need to know or do while I take this drug?  All products:  Tell dentists, surgeons, and other doctors that you use this drug.  This drug  may raise the chance of a broken leg. Talk with your doctor.  Have your blood work checked. Talk with your doctor.  Have a bone density test. Talk with your doctor.  Take calcium  and vitamin D  as you were told by your doctor.  Have a dental exam before starting this drug.  Take good care of your teeth. See a dentist often.  If you smoke, talk with your doctor.  Do not give to a child. Talk with your doctor.  Tell your doctor if you are breast-feeding. You will need to talk about any risks to your baby.  Xgeva:  This drug may cause harm to the unborn baby if you take it while you are pregnant. If you get pregnant while taking this drug, call your doctor right away.  ProliaT:  Very bad infections have been reported with use of this drug. If you have any infection, are taking antibiotics now or in the recent past, or have many infections, talk with your doctor.  You may have more chance of getting an infection. Wash hands often. Stay away from people with infections, colds, or flu.  Use birth control that you can trust to prevent pregnancy while taking this drug.  If you are a man and your sex partner is pregnant or gets pregnant at any time while you are being treated, talk with your doctor. What are some side effects that I need to call my doctor about right away?  WARNING/CAUTION: Even though it may be rare, some people may have very bad and sometimes deadly side effects when taking a drug. Tell your doctor or get medical help right away if you have any of the following signs or symptoms that may be related to a very bad side effect:  All products:  Signs of an allergic reaction, like rash; hives; itching; red, swollen, blistered, or peeling skin with or without fever; wheezing; tightness in the chest or throat; trouble breathing or talking; unusual hoarseness; or swelling of the mouth, face, lips, tongue, or throat.  Signs of low calcium  levels like muscle cramps or spasms, numbness and  tingling, or seizures.  Mouth sores.  Any new or strange groin, hip, or thigh pain.  This drug may cause jawbone problems. The chance may be higher the longer you take this drug. The chance may be higher if you have cancer, dental problems, dentures that do not fit well, anemia, blood clotting problems, or an infection. The chance may also be higher if you are having dental work or if you are getting chemo, some steroid drugs, or radiation. Call your doctor right away if you have jaw swelling or pain.  Xgeva:  Not hungry.  Muscle pain or weakness.  Seizures.  Shortness of breath.  ProliaT:  Signs of infection. These include a fever of 100.52F (38C) or higher, chills, very bad sore throat, ear or sinus pain, cough, more sputum or change in color of sputum, pain with passing urine, mouth sores, wound that will not heal, or anal itching or pain.  Signs of a pancreas problem (pancreatitis) like very bad stomach pain, very bad back pain, or very bad upset stomach or throwing up.  Chest pain.  A heartbeat that does not feel normal.  Very bad skin irritation.  Feeling very tired or weak.  Bladder pain or pain when passing urine or change in how much urine is passed.  Passing urine often.  Swelling in the arms or legs. What are some other side effects of this drug?  All drugs may cause side effects. However, many people have no side effects or only have minor side effects. Call your doctor or get medical help if any of these side effects or any other side effects bother you or do not go away:  Xgeva:  Feeling tired or weak.  Headache.  Upset stomach or throwing up.  Loose stools (diarrhea).  Cough.  ProliaT:  Back pain.  Muscle or joint pain.  Sore throat.  Runny nose.  Pain in arms or legs.  These are not all of the side effects that may occur. If you have questions about side effects, call your doctor. Call your doctor for medical advice about side effects.  You may report side effects  to your national health agency. How is this drug best taken?  Use this drug as ordered by your doctor. Read and follow the dosing on the label closely.  It is given as a shot into the fatty part of the skin. What do I do if I miss a dose?  Call the doctor to find out what to do. How do I store and/or throw out this drug?  This drug will be given to you in a hospital or doctor's office. You will not store it at home.  Keep all drugs out of the reach of children and pets.  Check with your pharmacist about how to throw out unused drugs.  General drug facts  If your symptoms or health problems do not get better or if they become worse, call your doctor.  Do not share your drugs with others and do not take anyone else's drugs.  Keep a list of all your drugs (prescription, natural products, vitamins, OTC) with you. Give this list to your doctor.  Talk with the doctor before starting any new drug, including prescription or OTC, natural products, or vitamins.  Some drugs may have another patient information leaflet. If you have any questions about this drug, please talk with your doctor, pharmacist, or other health care provider.  If you think there has been an overdose, call your poison control center or get medical care right away. Be ready to tell or show what was taken, how much, and when it happened.

## 2024-06-29 NOTE — Progress Notes (Signed)
 Patient ID: Tracey Patrick, female   DOB: 1956-09-28, 68 y.o.   MRN: [id number]  HPI  Tracey Patrick is a 68 y.o.-year-old pleasant female, referred by her PCP, Dr. Charlett, for management of osteoporosis (OP).  Pt was dx with OP in 2025.  Afterwards, Dr. Mat advised her to increase calcium  to 2000 mg daily.  She also continues on vitamin D  and multivitamin.  Few days ago, she saw an orthopedic provider specializing in osteoporosis and she was advised to start on antiosteoporotic medications.  She would like to get a second opinion today.  I reviewed pt's DXA scans: Date L1-L4 T score FN T score 33% distal Radius  06/26/2024  ([facility]) -0.5 RFN: -2.5 LFN: -2.7 N/a  11/07/2009 ([facility]) +0.7 RFN: -0.3 LFN: -0.7 N/a   She has mild scoliosis.    She denies fractures.  No falls.  No dizziness/vertigo/orthostasis/poor vision.   No previous OP treatments.  No h/o vitamin D  deficiency. Reviewed available vit D levels: 06/23/2024: Vitamin D  76.8 Lab Results  Component Value Date   VD25OH 72.29 09/11/2023   Pt is on: - calcium  1000 mg (in Bone Up: Ca ascorbate) + the amount in MVI - vitamin D    She is doing weight bearing exercises -150 minutes a week including upper body exercises.  She does not take high vitamin A doses.  No history of steroids.  She drinks 1 glass of red wine daily.  No smoking.  Menopause was at 68 y/o (2014).   FH of osteoporosis: In mother (early hysterectomy) -she had an ankle fracture.  No h/o hyper/hypocalcemia or hyperparathyroidism. No h/o kidney stones. 06/23/2024: Calcium  10, PTH 28, vitamin D  76.8 Lab Results  Component Value Date   CALCIUM  9.9 12/25/2023   CALCIUM  10.5 09/11/2023   CALCIUM  10.0 01/05/2022   CALCIUM  10.1 04/28/2020   CALCIUM  10.5 (H) 03/31/2020   CALCIUM  10.0 12/23/2019   CALCIUM  9.3 09/24/2008   No h/o thyrotoxicosis. Reviewed TSH recent levels:  Lab Results  Component Value Date   TSH 1.18 12/25/2023    TSH 1.13 01/05/2022   TSH 2.41 01/02/2018   TSH 1.11 09/24/2008   No h/o CKD. Last BUN/Cr: Lab Results  Component Value Date   BUN 17 12/25/2023   CREATININE 0.7 12/25/2023   She has a history of hyperlipidemia, on Crestor  10 mg daily and Repatha .  A coronary calcium  score was 4 (64 percentile for age) - saw Dr. Lavona.  Her LP(a) was not elevated. She also has allergies and is on injections. Has a remote history of endometriosis  ROS: Constitutional: no weight gain, no weight loss, no fatigue, no subjective hyperthermia, no subjective hypothermia, no nocturia Eyes: no blurry vision, no xerophthalmia ENT: no sore throat, no nodules palpated in neck, no dysphagia, no odynophagia, no hoarseness, no tinnitus, no hypoacusis Cardiovascular: no CP, no SOB, no palpitations, no leg swelling Respiratory: no cough, no SOB, no wheezing Gastrointestinal: no N, no V, no D, no C, no acid reflux Musculoskeletal: no muscle, no joint aches Skin: no rash, no hair loss Neurological: no tremors, no numbness or tingling/no dizziness/no HAs Psychiatric: no depression, no anxiety  I reviewed pt's medications, allergies, PMH, social hx, family hx, and changes were documented in the history of present illness. Otherwise, unchanged from my initial visit note.  Past Medical History:  Diagnosis Date   CHEST PAIN, ATYPICAL 09/24/2008   Qualifier: Diagnosis of  By: Laurice, CMA (AAMA), Clotilda RAMAN    COLONIC POLYPS,  HX OF 10/04/2008   Qualifier: Diagnosis of  By: Charlett MD, Wanda K    Desensitization to allergy shot    fleeta smock  2016   Endometriosis    ENDOMETRIOSIS 09/24/2008   Qualifier: Diagnosis of  By: Laurice, CMA (AAMA), Clotilda RAMAN    HEMORRHOIDS 09/24/2008   Qualifier: Diagnosis of  By: Laurice, CMA (AAMA), Clotilda RAMAN    SCOLIOSIS 09/24/2008   Qualifier: Diagnosis of  By: Laurice, CMA (AAMA), Clotilda RAMAN    Past Surgical History:  Procedure Laterality Date   ENDOMETRIAL ABLATION  2004    LIPOSUCTION  2019   OSTEOTOMY  1984   for overbite   maxillary    Social History   Socioeconomic History   Marital status: Married    Spouse name: Not on file   Number of children: Not on file   Years of education: Not on file   Highest education level: Professional school degree (e.g., MD, DDS, DVM, JD)  Occupational History   Not on file  Tobacco Use   Smoking status: Never   Smokeless tobacco: Never  Vaping Use   Vaping status: Never Used  Substance and Sexual Activity   Alcohol use: Yes    Alcohol/week: 6.0 standard drinks of alcohol    Types: 6 Glasses of wine per week    Comment: wine nightly   Drug use: No   Sexual activity: Not on file  Other Topics Concern   Not on file  Social History Narrative   hh of 2  Married    MD opthalmologist  Never tobacco    G1P0   Social Drivers of Corporate investment banker Strain: Low Risk  (09/07/2023)   Overall Financial Resource Strain (CARDIA)    Difficulty of Paying Living Expenses: Not hard at all  Food Insecurity: No Food Insecurity (09/07/2023)   Hunger Vital Sign    Worried About Running Out of Food in the Last Year: Never true    Ran Out of Food in the Last Year: Never true  Transportation Needs: No Transportation Needs (09/07/2023)   PRAPARE - Administrator, Civil Service (Medical): No    Lack of Transportation (Non-Medical): No  Physical Activity: Sufficiently Active (09/07/2023)   Exercise Vital Sign    Days of Exercise per Week: 5 days    Minutes of Exercise per Session: 40 min  Stress: No Stress Concern Present (09/07/2023)   [employer] of Occupational Health - Occupational Stress Questionnaire    Feeling of Stress : Not at all  Social Connections: Unknown (09/07/2023)   Social Connection and Isolation Panel    Frequency of Communication with Friends and Family: More than three times a week    Frequency of Social Gatherings with Friends and Family: Once a week    Attends Religious  Services: Patient declined    Database administrator or Organizations: No    Attends Engineer, structural: Not on file    Marital Status: Married  Catering manager Violence: Not on file   Current Outpatient Medications on File Prior to Visit  Medication Sig Dispense Refill   CALCIUM  CARBONATE-VIT D-MIN PO Take by mouth. 500 MG OF CALCIUM      co-enzyme Q-10 50 MG capsule Take 50 mg by mouth daily.     cycloSPORINE (RESTASIS) 0.05 % ophthalmic emulsion Place 1 drop into both eyes 2 (two) times daily.     docusate sodium (COLACE) 100 MG capsule Take 100 mg by mouth  daily.      doxycycline  (VIBRAMYCIN ) 100 MG capsule Take 1 capsule (100 mg total) by mouth in the morning and in the evening. Do all this for 5 days. Take with at least 8 ounces (large glass) of water, do not lie down for 30 minutes after. 10 capsule 0   Evolocumab  (REPATHA  SURECLICK) 140 MG/ML SOAJ Inject 140 mg into the skin every 14 (fourteen) days. 6 mL 3   Ferrous Fumarate (IRON) 18 MG TBCR Take by mouth 2 (two) times a week.     HYDROcodone  bit-homatropine (HYCODAN) 5-1.5 MG/5ML syrup Take 5 mLs by mouth every 8 (eight) hours as needed for cough. (Patient not taking: Reported on 03/16/2024) 120 mL 0   Lutein 20 MG CAPS Take 20 mg by mouth daily.     Multiple Vitamin (MULTIVITAMIN) tablet Take 1 tablet by mouth daily.     Omega-3 Fatty Acids (FISH OIL PO) Take 1 g by mouth daily.     OVER THE COUNTER MEDICATION ZEAXANTHIN - 2 MG DAILY     PRESCRIPTION MEDICATION ALLERGY SHOTS     Psyllium (METAMUCIL FIBER PO) Take 20 g by mouth daily.     rosuvastatin  (CRESTOR ) 10 MG tablet TAKE 1 TABLET ONCE DAILY. (Patient taking differently: 3 (three) times a week.) 90 tablet 3   rosuvastatin  (CRESTOR ) 10 MG tablet Take 1 tablet (10 mg total) by mouth daily. (Patient not taking: Reported on 03/16/2024) 90 tablet 3   scopolamine  (TRANSDERM-SCOP) 1 MG/3DAYS Place 1 patch (1.5 mg total) onto the skin every 3 (three) days. 10 patch 1    Turmeric (QC TUMERIC COMPLEX) 500 MG CAPS      No current facility-administered medications on file prior to visit.   No Known Allergies Family History  Problem Relation Age of Onset   Breast cancer Mother        died mva age 9   Heart attack Mother 44   Hyperlipidemia Mother    Macular degeneration Father    CVA Father        felt secondary to Vioxx   Hyperlipidemia Father    Hyperlipidemia Brother        3 brothes    Alcohol abuse Paternal Grandfather    PE: BP 106/60   Pulse 61   Ht 5' 1 (1.549 m)   Wt 109 lb 9.6 oz (49.7 kg)   SpO2 99%   BMI 20.71 kg/m  Wt Readings from Last 3 Encounters:  06/29/24 109 lb 9.6 oz (49.7 kg)  03/16/24 108 lb 9.6 oz (49.3 kg)  09/11/23 105 lb 12.8 oz (48 kg)   Constitutional: thin, in NAD. No kyphosis. Eyes: EOMI, no exophthalmos ENT: no thyromegaly, no cervical lymphadenopathy Cardiovascular: RRR, No MRG Respiratory: CTA B Musculoskeletal: no deformities Skin: no rashes Neurological: no tremor with outstretched hands  Assessment: 1. Osteoporosis  Plan: 1. Osteoporosis - likely postmenopausal/age-related, she has FH of OP and her mother - Discussed about ways to evaluate fracture risk.  The preferred imaging method is by densitometry.  Discussed about limitations of the method  and also about other imaging modalities available but not widely and not employed by the guidelines (for example  REMS scan).  On the DXA scan, an increased risk of fracture depends on the T score: greatly increased when the T score is lower than -2.5, but it is actually a continuum and -2.5 should not be regarded as an absolute threshold. We reviewed her 2 DXA scan reports together: There has been  a significant decrease in T-scores between 2011 and 2024.  Based on the T scores, she appears to be at an increased risk for fractures.  However, she has a small frame and we discussed that for small bones, DXA scan can under-estimate the bone density.  The fact that  the bone density decreased over time, would point towards a real decrease in bone density, though. - It is possible that her decreasing bone density could have been contributed to by her diet. She is on a calorie restricted diet with periods of time when she is only eating 1000 cal a day.  I recommended to make sure she gets 1000-1200 mg of calcium  daily preferentially from diet.  She is currently taking approximately 1500 mg of calcium  daily only from the supplements.  We discussed about reducing the supplement to only 500 mg daily and obtain the rest from the diet, aiming to get at least another 700 mg daily.  I did recommend to maintain a good amount of protein in her diet.  The recommended daily protein intake is approximately 0.8 g/kg/day (at least 40 g a day for her, but I recommended to aim for approximately 50 g a day).  I advised her to aim for this amount, since a diet low in proteins can exacerbate osteoporosis.  I recommended a following book for the concept of low acid eating:  -She is not smoking or drinking more than 2 alcoholic drinks a day. - discussed fall precautions   - given handout from [facility] Re: weight bearing exercises - advised to do this every day or at least 5/7 days.  She is doing a good job with this, averaging ~150 minutes of exercise a week - I also recommended the [facility] - for skeletal loading. Given brochure and explained benefits. - We discussed about the different medication classes, benefits and side effects (including atypical fractures and ONJ).  No dental work in progress. - I explained that first options are sq denosumab (Prolia) sq every 6 months for 6-10 years and zoledronic acid (Reclast) iv 1x a year for 1-2 years. I would use Teriparatide/Abaloparatide (which are daily subcu medication) or Romosozumab (monthly) as a last resort.  She agrees with Prolia.  She would prefer for this to be administered at the [facility].   Unfortunately, I am not able to order it there.  Will see if she agrees to having it in the [facility]. - Pt was given reading information about Prolia, and I explained the mechanism of action and expected benefits.  - will arrange for a Prolia inj - will check a new DXA scan in ~1 year after starting Prolia. The first indication that the treatment is working is her not having  fractures. DXA scan changes are secondary: unchanged or slightly higher T-scores are desirable. - will see pt back in a year  Lela Fendt, MD PhD [facility] Endocrinology

## 2024-07-06 ENCOUNTER — Other Ambulatory Visit: Payer: Self-pay

## 2024-07-06 DIAGNOSIS — M810 Age-related osteoporosis without current pathological fracture: Secondary | ICD-10-CM

## 2024-07-06 MED ORDER — DENOSUMAB 60 MG/ML ~~LOC~~ SOSY: 60.0000 mg | PREFILLED_SYRINGE | Freq: Once | SUBCUTANEOUS | Status: AC

## 2024-07-07 ENCOUNTER — Telehealth: Payer: Self-pay

## 2024-07-07 ENCOUNTER — Other Ambulatory Visit (HOSPITAL_COMMUNITY): Payer: Self-pay

## 2024-07-07 NOTE — Telephone Encounter (Signed)
 Tracey Patrick

## 2024-07-07 NOTE — Telephone Encounter (Signed)
 Prolia  VOB initiated via [email or web address]  Next Prolia  inj DUE: NEW START

## 2024-07-07 NOTE — Telephone Encounter (Signed)
 Pt ready for scheduling for PROLIA on or after : 07/07/24  Option# 1: Buy/Bill (Office supplied medication)  Out-of-pocket cost due at time of clinic visit: $0  Number of injection/visits approved: ---  Primary: MEDICARE Prolia co-insurance: 0% Admin fee co-insurance: 0%  Secondary: [employer] [place] Prolia co-insurance:  Admin fee co-insurance:   Medical Benefit Details: Date Benefits were checked: 07/07/24 Deductible: $257 Met of $257 Required/ Coinsurance: 0%/ Admin Fee: 0%  Prior Auth: N/A PA# Expiration Date:   # of doses approved: ----------------------------------------------------------------------- Option# 2- Med Obtained from pharmacy:  Pharmacy benefit: Copay $--- (Paid to pharmacy) Admin Fee: --- (Pay at clinic)  Prior Auth: --- PA# Expiration Date:   # of doses approved:   If patient wants fill through the pharmacy benefit please send prescription to: ---, and include estimated need by date in rx notes. Pharmacy will ship medication directly to the office.  Patient NOT eligible for Prolia Copay Card. Copay Card can make patient's cost as little as $25. Link to apply: [email or web address]://www.[email or web address]  ** This summary of benefits is an estimation of the patient's out-of-pocket cost. Exact cost may very based on individual plan coverage.

## 2024-07-08 ENCOUNTER — Other Ambulatory Visit (HOSPITAL_BASED_OUTPATIENT_CLINIC_OR_DEPARTMENT_OTHER): Payer: Self-pay

## 2024-07-09 ENCOUNTER — Other Ambulatory Visit (HOSPITAL_BASED_OUTPATIENT_CLINIC_OR_DEPARTMENT_OTHER): Payer: Self-pay

## 2024-07-11 ENCOUNTER — Encounter: Payer: Self-pay | Admitting: Internal Medicine

## 2024-07-17 ENCOUNTER — Encounter: Payer: Self-pay | Admitting: Internal Medicine

## 2024-07-20 ENCOUNTER — Ambulatory Visit

## 2024-07-23 LAB — HM DEXA SCAN: HM Dexa Scan: NORMAL

## 2024-07-24 ENCOUNTER — Ambulatory Visit

## 2024-07-27 ENCOUNTER — Other Ambulatory Visit: Payer: Self-pay | Admitting: Cardiology

## 2024-07-27 ENCOUNTER — Other Ambulatory Visit (HOSPITAL_BASED_OUTPATIENT_CLINIC_OR_DEPARTMENT_OTHER): Payer: Self-pay

## 2024-07-30 ENCOUNTER — Other Ambulatory Visit (HOSPITAL_BASED_OUTPATIENT_CLINIC_OR_DEPARTMENT_OTHER): Payer: Self-pay

## 2024-07-30 MED ORDER — ROSUVASTATIN CALCIUM 10 MG PO TABS
10.0000 mg | ORAL_TABLET | Freq: Every day | ORAL | 2 refills | Status: AC
  Filled 2024-07-30: qty 90, 90d supply, fill #0

## 2024-07-31 ENCOUNTER — Other Ambulatory Visit (HOSPITAL_BASED_OUTPATIENT_CLINIC_OR_DEPARTMENT_OTHER): Payer: Self-pay

## 2024-07-31 MED ORDER — ESTRADIOL 0.01 % VA CREA
0.5000 g | TOPICAL_CREAM | VAGINAL | 3 refills | Status: AC
  Filled 2024-07-31: qty 127.5, 85d supply, fill #0

## 2024-07-31 MED ORDER — MELOXICAM 15 MG PO TABS
15.0000 mg | ORAL_TABLET | Freq: Every day | ORAL | 0 refills | Status: AC
  Filled 2024-07-31: qty 30, 30d supply, fill #0

## 2024-08-03 ENCOUNTER — Encounter: Payer: Self-pay | Admitting: Internal Medicine

## 2024-08-05 ENCOUNTER — Other Ambulatory Visit (HOSPITAL_COMMUNITY): Payer: Self-pay

## 2024-08-06 ENCOUNTER — Other Ambulatory Visit (HOSPITAL_BASED_OUTPATIENT_CLINIC_OR_DEPARTMENT_OTHER): Payer: Self-pay

## 2024-08-18 ENCOUNTER — Other Ambulatory Visit (HOSPITAL_COMMUNITY): Payer: Self-pay

## 2024-08-24 ENCOUNTER — Other Ambulatory Visit (HOSPITAL_BASED_OUTPATIENT_CLINIC_OR_DEPARTMENT_OTHER): Payer: Self-pay

## 2024-08-24 ENCOUNTER — Ambulatory Visit

## 2024-08-24 MED ORDER — NITROGLYCERIN 0.2 MG/HR TD PT24
MEDICATED_PATCH | TRANSDERMAL | 0 refills | Status: AC
  Filled 2024-08-24: qty 30, 30d supply, fill #0

## 2024-08-26 ENCOUNTER — Other Ambulatory Visit (HOSPITAL_BASED_OUTPATIENT_CLINIC_OR_DEPARTMENT_OTHER): Payer: Self-pay

## 2024-08-26 MED ORDER — DOXYCYCLINE HYCLATE 100 MG PO TABS
100.0000 mg | ORAL_TABLET | Freq: Two times a day (BID) | ORAL | 0 refills | Status: AC
  Filled 2024-08-26: qty 14, 7d supply, fill #0

## 2024-08-26 MED ORDER — COMIRNATY 30 MCG/0.3ML IM SUSY
0.3000 mL | PREFILLED_SYRINGE | Freq: Once | INTRAMUSCULAR | 0 refills | Status: AC
  Filled 2024-08-26: qty 0.3, 1d supply, fill #0

## 2024-09-02 ENCOUNTER — Other Ambulatory Visit (HOSPITAL_BASED_OUTPATIENT_CLINIC_OR_DEPARTMENT_OTHER): Payer: Self-pay

## 2024-09-02 ENCOUNTER — Encounter: Payer: Self-pay | Admitting: Internal Medicine

## 2024-09-02 MED ORDER — COMIRNATY 30 MCG/0.3ML IM SUSY
0.3000 mL | PREFILLED_SYRINGE | Freq: Once | INTRAMUSCULAR | 0 refills | Status: AC
  Filled 2024-09-02 (×2): qty 0.3, 1d supply, fill #0

## 2024-09-25 ENCOUNTER — Other Ambulatory Visit (HOSPITAL_BASED_OUTPATIENT_CLINIC_OR_DEPARTMENT_OTHER): Payer: Self-pay

## 2024-09-25 MED ORDER — NITROGLYCERIN 0.2 MG/HR TD PT24
0.2000 mg | MEDICATED_PATCH | Freq: Every day | TRANSDERMAL | 0 refills | Status: AC
  Filled 2024-09-25: qty 30, 30d supply, fill #0

## 2024-10-14 ENCOUNTER — Other Ambulatory Visit: Payer: Self-pay

## 2024-10-14 ENCOUNTER — Other Ambulatory Visit (HOSPITAL_BASED_OUTPATIENT_CLINIC_OR_DEPARTMENT_OTHER): Payer: Self-pay

## 2024-10-15 ENCOUNTER — Other Ambulatory Visit (HOSPITAL_BASED_OUTPATIENT_CLINIC_OR_DEPARTMENT_OTHER): Payer: Self-pay

## 2024-10-30 ENCOUNTER — Other Ambulatory Visit (HOSPITAL_BASED_OUTPATIENT_CLINIC_OR_DEPARTMENT_OTHER): Payer: Self-pay

## 2024-10-30 MED ORDER — NITROGLYCERIN 0.2 MG/HR TD PT24
0.2000 mg | MEDICATED_PATCH | Freq: Every day | TRANSDERMAL | 0 refills | Status: AC
  Filled 2024-10-30: qty 30, 30d supply, fill #0

## 2024-11-04 ENCOUNTER — Ambulatory Visit

## 2024-11-04 VITALS — Ht 61.0 in | Wt 109.0 lb

## 2024-11-04 DIAGNOSIS — Z0000 Encounter for general adult medical examination without abnormal findings: Secondary | ICD-10-CM

## 2024-11-04 NOTE — Patient Instructions (Addendum)
 Dr. Cleatus,  Thank you for taking the time for your Medicare Wellness Visit. I appreciate your continued commitment to your health goals. Please review the care plan we discussed, and feel free to reach out if I can assist you further.  Please note that Annual Wellness Visits do not include a physical exam. Some assessments may be limited, especially if the visit was conducted virtually. If needed, we may recommend an in-person follow-up with your provider.  Ongoing Care Seeing your primary care provider every 3 to 6 months helps us  monitor your health and provide consistent, personalized care.   Referrals If a referral was made during today's visit and you haven't received any updates within two weeks, please contact the referred provider directly to check on the status.  Recommended Screenings:  Health Maintenance  Topic Date Due   COVID-19 Vaccine (11 - Mixed Product risk 2025-26 season) 03/02/2025   Breast Cancer Screening  04/24/2025   Medicare Annual Wellness Visit  11/04/2025   Colon Cancer Screening  02/09/2029   DTaP/Tdap/Td vaccine (4 - Td or Tdap) 12/13/2033   Pneumococcal Vaccine for age over 1  Completed   Flu Shot  Completed   Osteoporosis screening with Bone Density Scan  Completed   Hepatitis C Screening  Completed   Zoster (Shingles) Vaccine  Completed   Meningitis B Vaccine  Aged Out       11/04/2024    3:01 PM  Advanced Directives  Does Patient Have a Medical Advance Directive? Yes  Type of Estate Agent of [place];Living will  Does patient want to make changes to medical advance directive? No - Patient declined  Copy of Healthcare Power of Attorney in Chart? No - copy requested    Vision: Annual vision screenings are recommended for early detection of glaucoma, cataracts, and diabetic retinopathy. These exams can also reveal signs of chronic conditions such as diabetes and high blood pressure.  Dental: Annual dental screenings help  detect early signs of oral cancer, gum disease, and other conditions linked to overall health, including heart disease and diabetes.  Please see the attached documents for additional preventive care recommendations.

## 2024-11-04 NOTE — Progress Notes (Signed)
 "  Chief Complaint  Patient presents with   Medicare Wellness     Subjective:   Tracey Patrick is a 69 y.o. female who presents for a Medicare Annual Wellness Visit.  Visit info / Clinical Intake: Medicare Wellness Visit Type:: Subsequent Annual Wellness Visit Persons participating in visit and providing information:: patient Medicare Wellness Visit Mode:: Video Since this visit was completed virtually, some vitals may be partially provided or unavailable. Missing vitals are due to the limitations of the virtual format.: Documented vitals are patient reported If Telephone or Video please confirm:: I connected with patient using audio/video enable telemedicine. I verified patient identity with two identifiers, discussed telehealth limitations, and patient agreed to proceed. Patient Location:: Home Provider Location:: Office Interpreter Needed?: No Pre-visit prep was completed: yes AWV questionnaire completed by patient prior to visit?: yes Date:: 11/01/24 Living arrangements:: lives with spouse/significant other Patient's Overall Health Status Rating: very good Typical amount of pain: some Does pain affect daily life?: no Are you currently prescribed opioids?: no  Dietary Habits and Nutritional Risks How many meals a day?: 2 Eats fruit and vegetables daily?: yes Most meals are obtained by: preparing own meals In the last 2 weeks, have you had any of the following?: none Diabetic:: no  Functional Status Activities of Daily Living (to include ambulation/medication): Independent Ambulation: Independent with device- listed below Home Assistive Devices/Equipment: Eyeglasses Medication Administration: Independent Home Management (perform basic housework or laundry): Independent Manage your own finances?: yes Primary transportation is: driving Concerns about vision?: no *vision screening is required for WTM* Concerns about hearing?: no  Fall Screening Falls in the past year?:  1 Number of falls in past year: 0 Was there an injury with Fall?: 0 Fall Risk Category Calculator: 1 Patient Fall Risk Level: Low Fall Risk  Fall Risk Patient at Risk for Falls Due to: Impaired balance/gait Fall risk Follow up: Falls evaluation completed  Home and Transportation Safety: All rugs have non-skid backing?: yes All stairs or steps have railings?: yes Grab bars in the bathtub or shower?: yes Have non-skid surface in bathtub or shower?: yes Good home lighting?: yes Regular seat belt use?: yes Hospital stays in the last year:: no  Cognitive Assessment Difficulty concentrating, remembering, or making decisions? : no Will 6CIT or Mini Cog be Completed: yes What year is it?: 0 points What month is it?: 0 points Give patient an address phrase to remember (5 components): [address] [place]  About what time is it?: 0 points Count backwards from 20 to 1: 0 points Say the months of the year in reverse: 0 points Repeat the address phrase from earlier: 0 points 6 CIT Score: 0 points  Advance Directives (For Healthcare) Does Patient Have a Medical Advance Directive?: Yes Does patient want to make changes to medical advance directive?: No - Patient declined Type of Advance Directive: Healthcare Power of [place]; Living will Copy of Healthcare Power of Attorney in Chart?: No - copy requested Copy of Living Will in Chart?: No - copy requested  Reviewed/Updated  Reviewed/Updated: Reviewed All (Medical, Surgical, Family, Medications, Allergies, Care Teams, Patient Goals)    Allergies (verified) Patient has no known allergies.   Current Medications (verified) Outpatient Encounter Medications as of 11/04/2024  Medication Sig   CALCIUM  CARBONATE-VIT D-MIN PO Take by mouth. 500 MG OF CALCIUM    co-enzyme Q-10 50 MG capsule Take 50 mg by mouth daily.   cycloSPORINE (RESTASIS) 0.05 % ophthalmic emulsion Place 1 drop into both eyes 2 (two) times  daily.   docusate sodium  (COLACE) 100 MG capsule Take 100 mg by mouth daily.    doxycycline  (VIBRA -TABS) 100 MG tablet Take 1 tablet (100 mg total) by mouth 2 (two) times daily. (Patient not taking: Reported on 11/04/2024)   estradiol  (ESTRACE ) 0.01 % CREA vaginal cream Place 0.5 g vaginally 3 (three) times a week.   Evolocumab  (REPATHA  SURECLICK) 140 MG/ML SOAJ Inject 140 mg into the skin every 14 (fourteen) days.   Ferrous Fumarate (IRON) 18 MG TBCR Take by mouth 2 (two) times a week.   Lutein 20 MG CAPS Take 20 mg by mouth daily.   meloxicam  (MOBIC ) 15 MG tablet Take 1 tablet every day by oral route with meal(s). (Patient not taking: Reported on 11/04/2024)   Multiple Vitamin (MULTIVITAMIN) tablet Take 1 tablet by mouth daily.   nitroGLYCERIN  (NITRO-DUR ) 0.2 mg/hr patch Apply 1 patch every day by transdermal route.   nitroGLYCERIN  (NITRO-DUR ) 0.2 mg/hr patch Place 1 patch (0.2 mg total) onto the skin daily.   nitroGLYCERIN  (NITRO-DUR ) 0.2 mg/hr patch Place 1 patch (0.2 mg total) onto the skin daily.   Omega-3 Fatty Acids (FISH OIL PO) Take 1 g by mouth daily.   OVER THE COUNTER MEDICATION ZEAXANTHIN - 2 MG DAILY   PRESCRIPTION MEDICATION ALLERGY SHOTS   Psyllium (METAMUCIL FIBER PO) Take 20 g by mouth daily.   rosuvastatin  (CRESTOR ) 10 MG tablet Take 1 tablet (10 mg total) by mouth daily.   Turmeric (QC TUMERIC COMPLEX) 500 MG CAPS    Facility-Administered Encounter Medications as of 11/04/2024  Medication   denosumab  (PROLIA ) injection 60 mg    History: Past Medical History:  Diagnosis Date   Allergy 1970   controlled on weekly allergy shots   CHEST PAIN, ATYPICAL 09/24/2008   Qualifier: Diagnosis of  By: Laurice, CMA (AAMA), Shannon S    COLONIC POLYPS, HX OF 10/04/2008   Qualifier: Diagnosis of  By: Charlett MD, Wanda K    Desensitization to allergy shot    fleeta smock  2016   Endometriosis    ENDOMETRIOSIS 09/24/2008   Qualifier: Diagnosis of  By: Laurice, CMA (AAMA), Clotilda S    HEMORRHOIDS  09/24/2008   Qualifier: Diagnosis of  By: Laurice, CMA (AAMA), Clotilda S    Hyperlipidemia 2020   controlled on meds   SCOLIOSIS 09/24/2008   Qualifier: Diagnosis of  By: Laurice, CMA (AAMA), Clotilda RAMAN    Past Surgical History:  Procedure Laterality Date   COSMETIC SURGERY  abdominoplasty   2021   ENDOMETRIAL ABLATION  2004   LIPOSUCTION  2019   OSTEOTOMY  1984   for overbite   maxillary    Family History  Problem Relation Age of Onset   Breast cancer Mother        died mva age 43   Heart attack Mother 51   Hyperlipidemia Mother    Cancer Mother    Hypertension Mother    Macular degeneration Father    CVA Father        felt secondary to Vioxx   Hyperlipidemia Father    Vision loss Father    Hyperlipidemia Brother        3 brothes    Alcohol abuse Paternal Grandfather    Arthritis Brother    Hyperlipidemia Brother    Hypertension Brother    Hyperlipidemia Brother    Social History   Occupational History   Not on file  Tobacco Use   Smoking status: Never   Smokeless tobacco:  Never  Vaping Use   Vaping status: Never Used  Substance and Sexual Activity   Alcohol use: Yes    Alcohol/week: 6.0 standard drinks of alcohol    Types: 6 Glasses of wine per week    Comment: wine nightly   Drug use: Never   Sexual activity: Not Currently    Birth control/protection: Post-menopausal   Tobacco Counseling Counseling given: No  SDOH Screenings   Food Insecurity: No Food Insecurity (11/04/2024)  Housing: Low Risk (11/04/2024)  Transportation Needs: No Transportation Needs (11/04/2024)  Utilities: Not At Risk (11/04/2024)  Alcohol Screen: Low Risk (11/01/2024)  Depression (PHQ2-9): Low Risk (11/04/2024)  Financial Resource Strain: Low Risk (11/01/2024)  Physical Activity: Sufficiently Active (11/04/2024)  Social Connections: Moderately Integrated (11/04/2024)  Stress: No Stress Concern Present (11/04/2024)  Tobacco Use: Low Risk (11/04/2024)  Health Literacy: Adequate  Health Literacy (11/04/2024)   See flowsheets for full screening details  Depression Screen PHQ 2 & 9 Depression Scale- Over the past 2 weeks, how often have you been bothered by any of the following problems? Little interest or pleasure in doing things: 0 Feeling down, depressed, or hopeless (PHQ Adolescent also includes...irritable): 0 PHQ-2 Total Score: 0     Goals Addressed               This Visit's Progress     Continue physical activity (pt-stated)        Remain active!             Objective:    Today's Vitals   11/04/24 1458  Weight: 109 lb (49.4 kg)  Height: 5' 1 (1.549 m)   Body mass index is 20.6 kg/m.  Hearing/Vision screen Hearing Screening - Comments:: Denies hearing difficulties   Vision Screening - Comments:: Wears rx glasses - up to date with routine eye exams with  [facility] Immunizations and Health Maintenance Health Maintenance  Topic Date Due   COVID-19 Vaccine (11 - Mixed Product risk 2025-26 season) 03/02/2025   Mammogram  04/24/2025   Medicare Annual Wellness (AWV)  11/04/2025   Colonoscopy  02/09/2029   DTaP/Tdap/Td (4 - Td or Tdap) 12/13/2033   Pneumococcal Vaccine: 50+ Years  Completed   Influenza Vaccine  Completed   Bone Density Scan  Completed   Hepatitis C Screening  Completed   Zoster Vaccines- Shingrix   Completed   Meningococcal B Vaccine  Aged Out        Assessment/Plan:  This is a routine wellness examination for Mayari.  Patient Care Team: Panosh, Apolinar POUR, MD as PCP - General (Internal Medicine) Lavona Agent, MD as PCP - Cardiology (Cardiology) Luis Purchase, MD as Consulting Physician (Gastroenterology) Mat Browning, MD as Consulting Physician (Obstetrics and Gynecology)  I have personally reviewed and noted the following in the patients chart:   Medical and social history Use of alcohol, tobacco or illicit drugs  Current medications and supplements including opioid prescriptions. Functional  ability and status Nutritional status Physical activity Advanced directives List of other physicians Hospitalizations, surgeries, and ER visits in previous 12 months Vitals Screenings to include cognitive, depression, and falls Referrals and appointments  No orders of the defined types were placed in this encounter.  In addition, I have reviewed and discussed with patient certain preventive protocols, quality metrics, and best practice recommendations. A written personalized care plan for preventive services as well as general preventive health recommendations were provided to patient.   Rojelio LELON Blush, LPN   8/71/7973   Return in 6  weeks (on 11/10/2025).  After Visit Summary: (MyChart) Due to this being a telephonic visit, the after visit summary with patients personalized plan was offered to patient via MyChart   Nurse Notes: No voiced or noted concerns at this time "

## 2025-06-21 ENCOUNTER — Ambulatory Visit: Admitting: Internal Medicine

## 2025-11-10 ENCOUNTER — Ambulatory Visit
